# Patient Record
Sex: Female | Born: 1955 | Hispanic: Yes
Health system: Midwestern US, Academic
[De-identification: ages and names within clinical notes are randomized; demographics above are authoritative.]

---

## 2019-07-06 ENCOUNTER — Encounter: Admit: 2019-07-06 | Discharge: 2019-07-06

## 2019-07-06 DIAGNOSIS — M170 Bilateral primary osteoarthritis of knee: Secondary | ICD-10-CM

## 2019-07-23 ENCOUNTER — Ambulatory Visit: Admit: 2019-07-23 | Discharge: 2019-07-23

## 2019-07-23 ENCOUNTER — Encounter: Admit: 2019-07-23 | Discharge: 2019-07-23

## 2019-07-23 ENCOUNTER — Encounter: Admit: 2019-07-23 | Discharge: 2019-07-24

## 2019-07-23 DIAGNOSIS — M25552 Pain in left hip: Secondary | ICD-10-CM

## 2019-07-23 DIAGNOSIS — M170 Bilateral primary osteoarthritis of knee: Secondary | ICD-10-CM

## 2019-07-23 DIAGNOSIS — M1612 Unilateral primary osteoarthritis, left hip: Secondary | ICD-10-CM

## 2019-07-23 MED ORDER — ROPIVACAINE (PF) 5 MG/ML (0.5 %) IJ SOLN
5 mL | Freq: Once | INTRAMUSCULAR | 0 refills | Status: CP | PRN
Start: 2019-07-23 — End: ?
  Administered 2019-07-23: 17:00:00 5 mL via INTRAMUSCULAR

## 2019-07-23 MED ORDER — TRIAMCINOLONE ACETONIDE 40 MG/ML IJ SUSP
80 mg | Freq: Once | INTRAMUSCULAR | 0 refills | Status: CP | PRN
Start: 2019-07-23 — End: ?
  Administered 2019-07-23: 17:00:00 80 mg via INTRAMUSCULAR

## 2019-07-23 NOTE — Patient Instructions
It was our pleasure to see you today.  If you need anything further contact our office at [phone]  Luke Thompson MD & Kaysia Willard RN at [facility].    You received an injection today which included the following medications: Kenalog & Ropivacaine     Please follow the appropriate instructions below:   Recurring pain  Injections are done using a local anesthetic.  The numbing effect typically lasts for approximately one hour and then wears off. Expect pain to return after the first hour and then diminish within 2-3 days.   Rest the area  Be careful with the injected area/joint. Do not overuse this area.  Do not use this area for more than mild activities.  It is also important to not splint or keep this area completely still, unless instructed to do so.   Watch for signs of infection  Although precautions were taken to prevent infection, be alert for signs of infection.  This includes: fever > 100 degrees farenheit, increased warmth or redness in the injected area, redness spreading from the injected site or swelling.  If any of these develop, please call Lucas Thompson, MD's office at [phone].   No excessive exercise for 48 hours. Normal daily activity is fine.    No heat for 48 hours. Warm showers are ok.

## 2019-07-23 NOTE — Progress Notes
Date of Service: 07/23/2019     Subjective:               Joint Pain   This is a chronic (Left hip pain constant and getting worse over the last year. Bilatearl knee pain getting progressivly worse over the last 2 years. ) problem. The current episode started more than 1 year ago. The problem occurs daily. The problem has been gradually worsening. Associated symptoms include arthralgias and joint swelling. Pertinent negatives include no numbness or weakness. The symptoms are aggravated by exertion, bending and standing (sitting). She has tried ice, NSAIDs, rest, position changes and relaxation for the symptoms. The treatment provided no relief.       Regina Dean is a 63 y.o. female.  She comes in today with bilateral hip pain for the last year and bilateral knee pain for the last 2 years.  She denies injury.  This is been progressive.  Hip pain causes her most significant issue and is worse on the left than it is on the right.  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  She points at the anterior lateral aspect of the hip as a site of pain.  Pain does radiate to the groin.  The right side is similar but not nearly as bad.  This seems to wax and wane.  The left side bothers her consistently.  Both knees bother her regularly though she denies swelling.  This makes it difficult for her to get up off the ground and bend or squat.  She points to the anterior knee as a site of pain.  She denies recent injury.  No neurologic symptoms.       Review of Systems   Musculoskeletal: Positive for arthralgias and joint swelling.   Neurological: Negative for weakness and numbness.   All other systems reviewed and are negative.        Objective:         ??? atorvastatin (LIPITOR) 10 mg tablet every 24 hours.   ??? meloxicam (MOBIC) 15 mg tablet Take 15 mg by mouth daily.   ??? metFORMIN (GLUCOPHAGE) 500 mg tablet Take 500 mg by mouth twice daily.     Vitals:    07/23/19 0931   BP: (!) 143/86   Pulse: 74 SpO2: 99%   Weight: 64.9 kg (143 lb)   Height: 170.2 cm (67)     Body mass index is 22.4 kg/m???.     Physical Exam  Constitutional:       General: She is not in acute distress.     Appearance: She is well-developed. She is not diaphoretic.   HENT:      Head: Normocephalic and atraumatic.   Eyes:      Conjunctiva/sclera: Conjunctivae normal.   Neck:      Musculoskeletal: Normal range of motion.   Pulmonary:      Effort: Pulmonary effort is normal.   Musculoskeletal:      Right knee: She exhibits no effusion.      Left knee: She exhibits no effusion.   Skin:     General: Skin is warm and dry.      Findings: No erythema.   Neurological:      Mental Status: She is alert and oriented to person, place, and time.       Right Knee Exam     Tenderness   The patient is experiencing tenderness in the lateral joint line and medial joint line.  Range of Motion   The patient has normal right knee ROM.    Tests   McMurray:  Medial - negative Lateral - negative  Varus: negative Valgus: negative  Lachman:  Anterior - negative      Drawer:  Anterior - negative    Posterior - negative  Patellar apprehension: negative    Other   Erythema: absent  Scars: absent  Sensation: normal  Pulse: present  Swelling: none  Effusion: no effusion present      Left Knee Exam     Tenderness   The patient is experiencing tenderness in the lateral joint line and medial joint line.    Range of Motion   The patient has normal left knee ROM.    Tests   McMurray:  Medial - negative Lateral - negative  Varus: negative Valgus: negative  Lachman:  Anterior - negative      Drawer:  Anterior - negative     Posterior - negative  Patellar apprehension: negative    Other   Erythema: absent  Scars: absent  Sensation: normal  Pulse: present  Swelling: none  Effusion: no effusion present      Right Hip Exam     Tenderness   The patient is experiencing tenderness in the greater trochanter.    Range of Motion   Flexion: 120   External rotation: 60 Internal rotation: 20     Muscle Strength   Abduction: 5/5   Adduction: 5/5   Flexion: 5/5     Tests   FABER: negative  Ober: negative    Other   Erythema: absent  Scars: absent  Sensation: normal  Pulse: present    Comments:  FADIR positive.  Passive logroll negative.      Left Hip Exam     Tenderness   The patient is experiencing tenderness in the greater trochanter.    Range of Motion   Flexion: 90   External rotation: 60   Internal rotation: 10     Muscle Strength   Abduction: 5/5   Adduction: 5/5   Flexion: 5/5     Tests   FABER: negative  Ober: negative    Other   Erythema: absent  Scars: absent  Sensation: normal  Pulse: present    Comments:  FADIR positive.  Passive logroll positive.                 Assessment and Plan:       Regina Dean. Rider was seen today for pain, pain and pain.    Diagnoses and all orders for this visit:    Primary osteoarthritis of bilateral hips  -X-rays completed today of bilateral hips show marked femoral acetabular osteoarthritic change.  She has cystic change of the acetabulum as well as the femoral head bilaterally.  This is worse on the left than right.  -Pain likely secondary to chronic osteoarthritic change of bilateral hips.  Left worse than right.  The nature of this pathology was discussed with the patient.  -Imaging: No further imaging at this time  -We discussed treatment options including oral NSAIDs, Injection and/or PT  -Lifestyle modifications: Limit high impact activity.  -Medication: Oral medications as needed.  -Therapy: Recommended formal physical therapy at this time for proximal lower extremity strengthening.  -Intervention: Kenalog injection of the left hip completed today in clinic under ultrasound.  See procedure note for details.  Post procedure precautions were given.  -Activity: As tolerated      Bilateral primary  osteoarthritis of knee  -X-rays completed today show mild to moderate tricompartmental osteoarthritic change of bilateral knees.  This is worse in the patellofemoral space.  -Pain likely secondary to osteoarthritic change of bilateral knees.  The nature of this pathology was discussed with the patient.  -Imaging: No further imaging today.  -Lifestyle modifications:Limit high impact activity.  -Medication:Oral medications as needed.  -Therapy: Recommended formal physical therapy this time for proximal lower extremity strengthening.  -Intervention: No further intervention today.  May consider injection in the future.  -Activity: As tolerated        -RTO in 2 weeks or sooner PRN increased pain.  May consider injection of the right hip at that time.  We discussed potential for future hip replacement.  -The pt understands the above assessment and plan and has no further questions       Lynnda Child, MD      Parts of this clinic note were completed using a dragon voice recognition software system. Please excuse any misspellings or grammatical errors.  If you need clarification please contact my office.

## 2019-07-23 NOTE — Procedures
Large Joint Drain/Inject: L hip joint    Consent:   Consent obtained: verbal  Consent given by: patient  Risks discussed: arrhythmia, bleeding, damage to surrounding structures, hyperglycemia, infection, pain, skin discoloration and soft tissue reaction  Alternatives discussed: alternative treatment, delayed treatment and no treatment  Discussed with patient the purpose of the treatment/procedure, other ways of treating my condition, including no treatment/ procedure and the risks and benefits of the alternatives. Patient has decided to proceed with treatment/procedure.        Universal Protocol:  Imaging studies: imaging studies available  Site marked: the operative site was marked  Patient identity confirmed: Patient identify confirmed verbally with patient.          Procedures Details:  Indications: pain  Details:Prep: 2% chlorhexidine  Local anesthetic: ethyl chloride spray   22 G needle, ultrasound-guided  Justification for use of ultrasound guidance: The use of direct sonographic visualization of the needle was required to ensure accurate injection placement for diagnostic specificity, to maximize clinical efficacy and for safety purposes to minimize risk of bleeding or injury to nearby neurovascular structures   anterior approachMedications: 5 mL ropivacaine (PF) 0.5% (5 mg/mL); 80 mg triamcinolone acetonide 40 mg/mL  Outcome: tolerated well, no immediate complications

## 2019-07-24 ENCOUNTER — Encounter: Admit: 2019-07-24 | Discharge: 2019-07-24

## 2019-07-24 DIAGNOSIS — R079 Chest pain, unspecified: Secondary | ICD-10-CM

## 2019-07-24 DIAGNOSIS — E785 Hyperlipidemia, unspecified: Secondary | ICD-10-CM

## 2019-07-24 DIAGNOSIS — C4491 Basal cell carcinoma of skin, unspecified: Secondary | ICD-10-CM

## 2019-09-05 ENCOUNTER — Ambulatory Visit: Admit: 2019-09-05 | Discharge: 2019-09-05

## 2019-09-05 ENCOUNTER — Encounter: Admit: 2019-09-05 | Discharge: 2019-09-05

## 2019-09-05 DIAGNOSIS — C4491 Basal cell carcinoma of skin, unspecified: Secondary | ICD-10-CM

## 2019-09-05 DIAGNOSIS — M170 Bilateral primary osteoarthritis of knee: Secondary | ICD-10-CM

## 2019-09-05 DIAGNOSIS — R079 Chest pain, unspecified: Secondary | ICD-10-CM

## 2019-09-05 DIAGNOSIS — E785 Hyperlipidemia, unspecified: Secondary | ICD-10-CM

## 2019-09-05 NOTE — Patient Instructions
It was our pleasure to see you today.  If you need anything further contact our office at [phone]  Luke Thompson MD & Brietta Manso RN at [facility].    You received an injection today which included the following medications: Kenalog & Ropivacaine     Please follow the appropriate instructions below:   Recurring pain  Injections are done using a local anesthetic.  The numbing effect typically lasts for approximately one hour and then wears off. Expect pain to return after the first hour and then diminish within 2-3 days.   Rest the area  Be careful with the injected area/joint. Do not overuse this area.  Do not use this area for more than mild activities.  It is also important to not splint or keep this area completely still, unless instructed to do so.   Watch for signs of infection  Although precautions were taken to prevent infection, be alert for signs of infection.  This includes: fever > 100 degrees farenheit, increased warmth or redness in the injected area, redness spreading from the injected site or swelling.  If any of these develop, please call Lucas Thompson, MD's office at [phone].   No excessive exercise for 48 hours. Normal daily activity is fine.    No heat for 48 hours. Warm showers are ok.

## 2019-09-27 ENCOUNTER — Encounter: Admit: 2019-09-27 | Discharge: 2019-09-27 | Payer: No Typology Code available for payment source

## 2019-09-27 DIAGNOSIS — C4491 Basal cell carcinoma of skin, unspecified: Secondary | ICD-10-CM

## 2019-09-27 DIAGNOSIS — E785 Hyperlipidemia, unspecified: Secondary | ICD-10-CM

## 2019-09-27 DIAGNOSIS — R079 Chest pain, unspecified: Secondary | ICD-10-CM

## 2019-09-27 MED ORDER — ROPIVACAINE (PF) 5 MG/ML (0.5 %) IJ SOLN
3 mL | Freq: Once | INTRAMUSCULAR | 0 refills | Status: CP | PRN
Start: 2019-09-27 — End: ?
  Administered 2019-09-05: 16:00:00 3 mL via INTRAMUSCULAR

## 2019-09-27 MED ORDER — TRIAMCINOLONE ACETONIDE 40 MG/ML IJ SUSP
40 mg | Freq: Once | INTRAMUSCULAR | 0 refills | Status: CP | PRN
Start: 2019-09-27 — End: ?
  Administered 2019-09-05: 16:00:00 40 mg via INTRAMUSCULAR

## 2019-09-28 ENCOUNTER — Encounter: Admit: 2019-09-28 | Discharge: 2019-09-28 | Payer: No Typology Code available for payment source

## 2019-09-28 ENCOUNTER — Ambulatory Visit: Admit: 2019-09-28 | Discharge: 2019-09-28 | Payer: No Typology Code available for payment source

## 2019-09-28 DIAGNOSIS — R079 Chest pain, unspecified: Secondary | ICD-10-CM

## 2019-09-28 DIAGNOSIS — E785 Hyperlipidemia, unspecified: Secondary | ICD-10-CM

## 2019-09-28 DIAGNOSIS — M1612 Unilateral primary osteoarthritis, left hip: Secondary | ICD-10-CM

## 2019-09-28 DIAGNOSIS — C4491 Basal cell carcinoma of skin, unspecified: Secondary | ICD-10-CM

## 2019-09-28 MED ORDER — TRAMADOL 50 MG PO TAB
50 mg | ORAL_TABLET | ORAL | 0 refills | Status: DC | PRN
Start: 2019-09-28 — End: 2019-10-09

## 2019-09-28 MED ORDER — CYCLOBENZAPRINE 5 MG PO TAB
5 mg | ORAL_TABLET | Freq: Three times a day (TID) | ORAL | 0 refills | 30.00000 days | Status: DC | PRN
Start: 2019-09-28 — End: 2019-10-09

## 2019-09-28 NOTE — Progress Notes
Date of Service: 09/28/2019     Subjective:               Joint Pain   This is a chronic (Left hip pain intense pain causing decrease in ability to ambulate starting 09/26/19. ) problem. The current episode started more than 1 year ago. The problem occurs daily. The problem has been rapidly worsening. Associated symptoms include arthralgias and joint swelling. Pertinent negatives include no numbness or weakness. The symptoms are aggravated by exertion, bending and standing (sitting). She has tried ice, NSAIDs, rest, position changes and relaxation (Walker, advil and tylenol. ) for the symptoms. The treatment provided no relief.       Regina Dean is a 63 y.o. female.  She has a history of diabetes.  She returns to clinic today with her daughter as an interpreter.  She notes return of severe left hip pain over the last couple of days.  Last weekend she notes return of severe left hip pain over the last couple of days.  Last weekend she started noticing some decreased range of motion but no pain.  She notes return of severe left hip pain over the last couple of days.  Last weekend she started noticing some decreased range of motion but no pain.  With her previous injection she had near complete relief of her left hip pain.  On Wednesday she woke up and had severe pain and has not been able to bear weight since.  She points at the anterior and posterior aspect of the hip as a site of pain.  She feels like the pain is radiating through the center of her hip.  She denies injury.  Her knees are feeling improved.  She is here today for further recommendations.         Review of Systems   Musculoskeletal: Positive for arthralgias and joint swelling.   Neurological: Negative for weakness and numbness.   All other systems reviewed and are negative.        Objective:         ? aspirin EC 81 mg tablet Take 81 mg by mouth daily.   ? atorvastatin (LIPITOR) 10 mg tablet every 24 hours. ? cyclobenzaprine (FLEXERIL) 5 mg tablet Take one tablet by mouth three times daily as needed for Muscle Cramps.   ? FLAXSEED OIL (OMEGA 3 PO) Take  by mouth daily.   ? meloxicam (MOBIC) 15 mg tablet Take 15 mg by mouth daily.   ? meloxicam (MOBIC) 15 mg tablet Take  by mouth daily.   ? metFORMIN (GLUCOPHAGE) 500 mg tablet Take 500 mg by mouth twice daily.   ? traMADoL (ULTRAM) 50 mg tablet Take one tablet by mouth every 6 hours as needed for Pain.     Vitals:    09/28/19 0930   BP: 137/70   BP Source: Arm, Left Upper   Patient Position: Sitting   Pulse: 92   SpO2: 99%   PainSc: Ten     There is no height or weight on file to calculate BMI.     Physical Exam  Constitutional:       General: She is not in acute distress.     Appearance: She is well-developed. She is not diaphoretic.   HENT:      Head: Normocephalic and atraumatic.   Eyes:      Conjunctiva/sclera: Conjunctivae normal.   Neck:      Musculoskeletal: Normal range of motion.   Pulmonary:  Effort: Pulmonary effort is normal.   Musculoskeletal:      Right knee: She exhibits no effusion.      Left knee: She exhibits no effusion.   Skin:     General: Skin is warm and dry.      Findings: No erythema.   Neurological:      Mental Status: She is alert and oriented to person, place, and time.       Right Knee Exam     Tenderness   The patient is experiencing no tenderness.     Range of Motion   The patient has normal right knee ROM.    Tests   McMurray:  Medial - negative Lateral - negative  Varus: negative Valgus: negative  Lachman:  Anterior - negative      Drawer:  Anterior - negative    Posterior - negative  Pivot shift: negative  Patellar apprehension: negative    Other   Erythema: absent  Scars: absent  Sensation: normal  Pulse: present  Swelling: none  Effusion: no effusion present      Left Knee Exam     Tenderness   The patient is experiencing no tenderness.     Range of Motion   The patient has normal left knee ROM.    Tests McMurray:  Medial - negative Lateral - negative  Varus: negative Valgus: negative  Lachman:  Anterior - negative      Drawer:  Anterior - negative     Posterior - negative  Pivot shift: negative  Patellar apprehension: negative    Other   Erythema: absent  Scars: absent  Sensation: normal  Pulse: present  Swelling: none  Effusion: no effusion present      Left Hip Exam     Tenderness   The patient is experiencing tenderness in the posterior.    Range of Motion   Flexion: 20   External rotation: 10   Internal rotation: 10     Muscle Strength   Abduction: 3/5   Adduction: 3/5   Flexion: 3/5     Other   Erythema: absent  Scars: absent  Sensation: normal  Pulse: present                 Assessment and Plan:       Weslie Warburton. Feighner was seen today for pain, pain and pain.    Diagnoses and all orders for this visit:    Primary osteoarthritis of bilateral hips  -X-rays previously completed of bilateral hips which show marked femoral acetabular osteoarthritic change.  She has cystic change of the acetabulum as well as the femoral head bilaterally.  This is worse on the left than right.  I do not appreciate significant change in x-rays completed today compared to x-rays done 10 weeks ago.  -Pain likely secondary to chronic osteoarthritic change of bilateral hips.  She has had a rapid flareup.  -Imaging: No further imaging at this time  -Lifestyle modifications: Limit high impact activity.  -Medication: Prescribed Flexeril for glue tightness and tramadol for pain.  -Therapy: Continue home physical therapy as tolerated.  -Intervention: Urgent referral will be placed to see Dr. Opal Sidles for evaluation on potential hip replacement.  -Activity: As tolerated      Bilateral primary osteoarthritis of knee  -X-rays previously completed which show mild to moderate tricompartmental osteoarthritic change of bilateral knees.  This is worse in the patellofemoral space. -Pain likely secondary to osteoarthritic change of bilateral knees.  Pain significantly  improved with bilateral injections at her last visit.  -Imaging: No further imaging today.  -Lifestyle modifications:Limit high impact activity.  -Medication:Oral medications as needed. Continue previously prescribed Meloxicam.  -Therapy: Patient currently participating in physical therapy. Recommended continued therapy as long as she is progressing.  -Intervention: No further intervention today.  -Activity: As tolerated        -RTO PRN increased pain.  May consider injection of the right hip at that time.  We discussed potential for future hip replacement.  -The pt understands the above assessment and plan and has no further questions       Lynnda Child, MD      Parts of this clinic note were completed using a dragon voice recognition software system. Please excuse any misspellings or grammatical errors.  If you need clarification please contact my office.

## 2019-09-28 NOTE — Patient Instructions
It was our pleasure to see you today.  If you need anything further contact our office at [phone]  Luke Thompson MD & Chauncy Mangiaracina RN at [facility].

## 2019-10-03 LAB — SED RATE: Lab: 10 mm/h (ref 0–30)

## 2019-10-03 LAB — C REACTIVE PROTEIN (CRP): Lab: 0 mg/dL (ref <1.0)

## 2019-10-09 ENCOUNTER — Encounter: Admit: 2019-10-09 | Discharge: 2019-10-09

## 2019-10-09 MED ORDER — CYCLOBENZAPRINE 5 MG PO TAB
5 mg | ORAL_TABLET | Freq: Three times a day (TID) | ORAL | 0 refills | 21.00000 days | Status: DC | PRN
Start: 2019-10-09 — End: 2020-02-13

## 2019-10-09 MED ORDER — TRAMADOL 50 MG PO TAB
50 mg | ORAL_TABLET | ORAL | 0 refills | Status: DC | PRN
Start: 2019-10-09 — End: 2019-11-20

## 2019-10-11 ENCOUNTER — Encounter: Admit: 2019-10-11 | Discharge: 2019-10-11

## 2019-10-11 NOTE — Telephone Encounter
Explained lab results per interpreter Delfino Lovett [phone]. Negative results which indicates no infection. Tenisa states she has an appt with a new PCP and is trying to get an appt with a dentist. She will call me once ready to schedule THA

## 2019-11-05 ENCOUNTER — Ambulatory Visit: Admit: 2019-11-05 | Discharge: 2019-11-06 | Payer: No Typology Code available for payment source

## 2019-11-05 ENCOUNTER — Encounter: Admit: 2019-11-05 | Discharge: 2019-11-05

## 2019-11-05 ENCOUNTER — Ambulatory Visit: Admit: 2019-11-05 | Discharge: 2019-11-05 | Payer: No Typology Code available for payment source

## 2019-11-05 DIAGNOSIS — R3989 Other symptoms and signs involving the genitourinary system: Secondary | ICD-10-CM

## 2019-11-05 DIAGNOSIS — C4491 Basal cell carcinoma of skin, unspecified: Secondary | ICD-10-CM

## 2019-11-05 DIAGNOSIS — N898 Other specified noninflammatory disorders of vagina: Secondary | ICD-10-CM

## 2019-11-05 DIAGNOSIS — R339 Retention of urine, unspecified: Secondary | ICD-10-CM

## 2019-11-05 DIAGNOSIS — N952 Postmenopausal atrophic vaginitis: Secondary | ICD-10-CM

## 2019-11-05 DIAGNOSIS — N812 Incomplete uterovaginal prolapse: Secondary | ICD-10-CM

## 2019-11-05 DIAGNOSIS — R079 Chest pain, unspecified: Secondary | ICD-10-CM

## 2019-11-05 DIAGNOSIS — E785 Hyperlipidemia, unspecified: Secondary | ICD-10-CM

## 2019-11-05 DIAGNOSIS — N3946 Mixed incontinence: Secondary | ICD-10-CM

## 2019-11-05 NOTE — Progress Notes
DATE: 11/05/2019  REFERRING PHYSICIAN: Dr Referral Self      CHIEF COMPLAINT: Urinary Incontinence      History of Present Illness  Regina Dean is a 63 y.o. G3P3 PMP female seen for consultation regarding prolapse.  She has not had a hysterectomy.     She reports prolapse symptoms that started years ago. In 2013 she was fit with a pessary, which worked for a long time. She does not recall who she saw for this. About a year ago, the pessary was more easily expelled and she had some bleeding. She took it out about 3 months ago and did not reinsert it. (Sounds like a ring w/support). She has noticed a vaginal bulge that she can see and feel, worse since the pessary has been out. She does not report having pelvic pressure symptoms.  She does manually reduce the bulge.      She reports urinary symptoms for years. She reports urinary urgency and frequency and voids 15-20 times during the day and 5 times overnight. She reports leakage of urine with coughing, laughing, and sneezing. She reports leakage of urine with an urge. She does wear a pad for protection. She does feel that she empties her bladder completely but then has to void about 10 minutes later. She has not tried overactive bladder medications to help with her symptoms. She states she drinks 2-3 bottles of water daily and one coffee every morning.    She denies a history of UTIs.    She reports bowel symptoms including occasional constipation.  She denies fecal incontinence of gas and stool. She does not take fiber supplements. She has not had a colonoscopy.    She has left leg/hip pain is planning for a left hip replacement in the very near future.     She denies menopausal symptoms. She denies vaginal dryness.    She is not sexually active.      She reports that her mammograms are up to date and have been normal.  She reports that her last Pap smear was normal.    She has T2DM with most recent A1c 8%. She has another one pending in work-up for her total hip replacement.     REVIEW OF SYSTEMS:  Pertinent Review of Systems:  - General ROS: Denies recent weight change above 10 pounds, Denies malaise  - HEENT ROS: Denies blurring of vision. Denies double vision, Denies glaucoma, Denies dry eyes  - Cardiovascular ROS: Denies chest pain. Denies palpitations currently. Denies orthopnea  - Respiratory ROS: Denies shortness of breath,  Denies cough,  Denies wheezing currently  - Gastrointestinal ROS: Denies constipation, Denies gastric reflux,  Denies blood in stool, Denies diarrhea, Denies Irritable bowel syndrome, Denies nausea, Denies vomiting, Denies abdominal pain  - Endocrine ROS: POS polydipsia,  Denies excessive sweating. Denies hot flashes. Denies Thyroid disease  - Hematological and Lymphatic ROS: Denies easy bruisability , Denies current anemia, Denies adenopathy in the inguinal area  - Neurological ROS: Denies syncope, Denies numbness in lower extremities, Denies neuropathy. Denies shooting pain down the legs, Denies shooting pain in the lower back, Denies low back pain  - Musculoskeletal ROS: Denies Joint pain. Denies Edema in lower extremities. Denies fibromyalgia  - Psychological ROS: Denies depression, Denies anxiety, Denies thoughts of suicide, Denies thoughts of homicide, Denies recent seizure, Denies syncope   - Dermatological ROS: Denies eczema. Denies skin rashes in the groin area and other sites    ALLERGIES:  Patient has no known  allergies.    Medical History:   Diagnosis Date   ? Basal cell carcinoma of skin, site unspecified    ? Chest pain    ? Hyperlipemia        History reviewed. No pertinent surgical history.    Family History   Problem Relation Age of Onset   ? Unknown to Patient Mother    ? Unknown to Patient Father        Social History     Socioeconomic History   ? Marital status: Divorced     Spouse name: Not on file   ? Number of children: Not on file   ? Years of education: Not on file ? Highest education level: Not on file   Occupational History   ? Not on file   Tobacco Use   ? Smoking status: Never Smoker   ? Smokeless tobacco: Never Used   Substance and Sexual Activity   ? Alcohol use: No   ? Drug use: No   ? Sexual activity: Not on file   Other Topics Concern   ? Not on file   Social History Narrative    ** Merged History Encounter **              Current Outpatient Medications:   ?  aspirin EC 81 mg tablet, Take 81 mg by mouth daily., Disp: , Rfl:   ?  atorvastatin (LIPITOR) 10 mg tablet, every 24 hours., Disp: , Rfl:   ?  cyclobenzaprine (FLEXERIL) 5 mg tablet, Take one tablet by mouth three times daily as needed for Muscle Cramps., Disp: 30 tablet, Rfl: 0  ?  FLAXSEED OIL (OMEGA 3 PO), Take  by mouth daily., Disp: , Rfl:   ?  meloxicam (MOBIC) 15 mg tablet, Take 15 mg by mouth daily., Disp: , Rfl:   ?  metFORMIN (GLUCOPHAGE) 500 mg tablet, Take 500 mg by mouth twice daily., Disp: , Rfl:   ?  traMADoL (ULTRAM) 50 mg tablet, Take one tablet by mouth every 6 hours as needed for Pain., Disp: 20 tablet, Rfl: 0        Objective:         ? aspirin EC 81 mg tablet Take 81 mg by mouth daily.   ? atorvastatin (LIPITOR) 10 mg tablet every 24 hours.   ? cyclobenzaprine (FLEXERIL) 5 mg tablet Take one tablet by mouth three times daily as needed for Muscle Cramps.   ? FLAXSEED OIL (OMEGA 3 PO) Take  by mouth daily.   ? meloxicam (MOBIC) 15 mg tablet Take 15 mg by mouth daily.   ? metFORMIN (GLUCOPHAGE) 500 mg tablet Take 500 mg by mouth twice daily.   ? traMADoL (ULTRAM) 50 mg tablet Take one tablet by mouth every 6 hours as needed for Pain.     Vitals:    11/05/19 1253   BP: 129/72   BP Source: Arm, Right Upper   Patient Position: Sitting   Pulse: 76   Resp: 16   Temp: 36.8 ?C (98.3 ?F)   SpO2: 97%   Weight: 63.5 kg (140 lb)   Height: 171.5 cm (67.5)   PainSc: Zero     Body mass index is 21.6 kg/m?Marland Kitchen     Physical Exam    General appearance: not in acute distress, walking with normal gait Mental status :oriented to time, place and person; affect and mood appropriate; normal interaction  Cardiovascular: normal heart rate, bilateral LE pulses palpable  Chest / Respiratory: normal  respiratory effort, unlabored breathing   Back: No spine tenderness, no SI joint tenderness, No CVAT   Neck: No thryromegally, no adenopathy  Gastrointestinal / Abdomen: no masses, no rebound, no tenderness, soft, nondistended, no hernias, Laparoscopic (tubal ligation) incisions without tenderness   Extremities: no gross deformities, limited hip mobility, ambulates with a cane due to left hip/leg pain   Neruologic: no asymmetric weakness in LE, no abnormalities in sensation in LE  Peripheral vascular system: no cyanosis, no edema in LE, extremities warm  Dermatological: no perineal nevi or rashes noted   Genitourinary: firm, rubbery tissue noted at posterior vaginal apex just distal to posterior lip of cervix, friable and frond-like; tissue easily grasped with ring forceps and sent to pathology; 3cm linear area of hyperpigmentation at posterior introitus     Post Void Residual  (ml)  Void (spontaneous)       PVR by bladder scan 48 mL        Urinalysis  Leuks                 neg      Nitrites                 neg      Heme                 +                      (one choice per column)  Ext. Genitalia      Lesions noted No    Appearance consistent with age Yes   Vagina       Discharge Present No    Mucosa Atrophic Yes    Tissue Friable No    Tissue Tender To Palpation. No    Bladder      Masses Noted No    Tender to Palpation See below   Urethra      Midline Yes    Normal Size Yes    Urethral Tenderness On Palpation See below    Urethral Discharge Noted No    Any Lesions Noted. No     Caruncle No   Uterus        Normal Size Normal    Mobile Mobile    Tender NT   Adnexa       Masses palpated  None    Tenderness noted NT   Bimanual       Masses palpated  None   Perineum       visually intact. Yes    Anus External Hemorrhoids None   Myofascial Tenderness      Rt OI  0/10       Rt iliococcygeus  0/10       Rt puboccygeus/puborectalis  0/10       Posterior vaginal  0/10       Lt pubococcygeus/puborectalis  0/10       Lt iliococcygeus  0/10       Lt OI  0/10       Bladder  0/10       Urethra  0/10       Lt vulva  0/10       Rt vulva  0/10       Reflexes Bulbocavernosus Present    Anal wink Present         Oxford Squeeze strength: (0-5) 4 out of 5    Grade 0:  No discernible contraction Grade 1:  Very weak contraction, a?flicker?Marland Kitchen .  Grade 2:   Weak contraction Grade 3: Moderate contraction with some squeeze and lift ability. Grade 4 :   Good contraction; squeeze and lift against resistance.  Grade 5: Strong contraction, squeeze and lift against strong resistance.         Continence Stress Test (supine stress test)  negative      Pelvic Organ Prolapse Quantification (POP-Q):    Aa: +2.5 Ba:+2.5 C: -4   GH: 1.5/4.5 PB: 3/3 TVL: 6   Ap: -2.5 Bp:  -2.5 D: -4     Stage 3 anterior-predominant         ASSESSMENT:  Regina Dean is a 63 y.o.  G3P3 female seen in consultation and found to have Stage 3 anterior-predominant uterovaginal prolapse, urgency-predominant mixed urinary incontinence, and a firm, rubbery area of tissue at the upper posterior vagina.    CONSULTATION:  Prolapse:   - Management options were discussed for Stage 3 anterior-predominant uterovaginal prolapse including expectant management, conservative management (a pessary), and surgical management (obliterative vs reconstructive).   - She is not currently sexually active.     Mixed urinary incontinence:   - Management options were discussed for urgency-predominant MUI including expectant, conservative (pelvic floor physical therapy, incontinence pessary), medical management and surgical management (synthetic midurethral sling, sacral neuromodulation, intravesicular botulinum toxin injections). - Behavioral modifications including monitoring fluid intake (goal 50-60 oz per day), avoidance of bladder irritants (caffeine, artificial sweeteners, acidic foods), timed voids, and avoidance of excessive fluid intake before bedtime were reviewed.    PLAN:  Prolapse:   - She desires to have conservative management with a pessary while pre/post-op from her hip replacement. She may be interested in surgical management in the future.   - She will return for a pessary fitting in 2-3 weeks to allow for healing of the posterior vagina.     Mixed urinary incontinence:   - A urine culture is being sent today to rule out a UTI as a possible cause of her symptoms.   - She will incorporate the behavioral modifications discussed above.  - We reviewed the importance of blood sugar control as elevated blood sugars can exacerbate urinary symptoms  - Will readdress urinary symptoms with pessary placement.    Vaginal lesion:  - Discussed that tissue abnormality may reflect inflammatory changes from long-term pessary use vs epithelial hyperplasia/malignancy  - Specimen sent for pathology today  - Recommend awaiting pessary placement until tissue healed    Return to clinic for pessary fitting

## 2019-11-05 NOTE — Procedures
Within 10 minutes of voiding, a bladder scan was performed and showed a PVR of 48 mL.

## 2019-11-05 NOTE — Progress Notes
Pertinent Review of Systems:  - General ROS: Denies recent weight change above 10 pounds, Denies malaise  - HEENT ROS: Denies blurring of vision. Denies double vision, Denies glaucoma, Denies dry eyes  - Cardiovascular ROS: Denies chest pain. Denies palpitations currently. Denies orthopnea  - Respiratory ROS: Denies shortness of breath,  Denies cough,  Denies wheezing currently  - Gastrointestinal ROS: Denies constipation, Denies gastric reflux,  Denies blood in stool, Denies diarrhea, Denies Irritable bowel syndrome, Denies nausea, Denies vomiting, Denies abdominal pain  - Endocrine ROS: DeniesPOS polydipsia,  Denies excessive sweating. Denies hot flashes. Denies Thyroid disease  - Hematological and Lymphatic ROS: Denies easy bruisability , Denies current anemia, Denies adenopathy in the inguinal area  - Neurological ROS: Denies syncope, Denies numbness in lower extremities, Denies neuropathy. Denies shooting pain down the legs, Denies shooting pain in the lower back, Denies low back pain  - Musculoskeletal ROS: Denies Joint pain. Denies Edema in lower extremities. Denies fibromyalgia  - Psychological ROS: Denies depression, Denies anxiety, Denies thoughts of suicide, Denies thoughts of homicide, Denies recent seizure, Denies syncope   - Dermatological ROS: Denies eczema. Denies skin rashes in the groin area and other sites

## 2019-11-06 DIAGNOSIS — R3989 Other symptoms and signs involving the genitourinary system: Secondary | ICD-10-CM

## 2019-11-20 ENCOUNTER — Encounter: Admit: 2019-11-20 | Discharge: 2019-11-20

## 2019-11-20 MED ORDER — TRAMADOL 50 MG PO TAB
50 mg | ORAL_TABLET | ORAL | 0 refills | Status: DC | PRN
Start: 2019-11-20 — End: 2020-05-02

## 2019-11-26 MED ORDER — ESTRADIOL 0.01 % (0.1 MG/GRAM) VA CREA
1 g | Freq: Every day | VAGINAL | 11 refills | 30.00000 days | Status: DC
Start: 2019-11-26 — End: 2020-02-13

## 2019-12-10 ENCOUNTER — Ambulatory Visit: Admit: 2019-12-10 | Discharge: 2019-12-11 | Payer: No Typology Code available for payment source

## 2019-12-10 ENCOUNTER — Encounter: Admit: 2019-12-10 | Discharge: 2019-12-10

## 2019-12-10 DIAGNOSIS — C4491 Basal cell carcinoma of skin, unspecified: Secondary | ICD-10-CM

## 2019-12-10 DIAGNOSIS — N812 Incomplete uterovaginal prolapse: Secondary | ICD-10-CM

## 2019-12-10 DIAGNOSIS — R079 Chest pain, unspecified: Secondary | ICD-10-CM

## 2019-12-10 DIAGNOSIS — E785 Hyperlipidemia, unspecified: Secondary | ICD-10-CM

## 2019-12-10 NOTE — Progress Notes
PESSARY CHECK    CC: Pessary Maintainance    HPI  Regina Dean is a 63 y.o. female here today for a pessary check. She has Stage 3 anterior-predominant uterovaginal prolapse and desires to continue conservative management with a pessary. She was last seen on 11/26/19 for a pessary fitting and was fit with a 2 1/4 gelhorn pessary. At her initial visit she was noted to have a large area of vaginal granulation tissue, which was biopsied and treated with silver nitrate. This area looked significantly improved on follow up examination 2 weeks ago.     She reports that the pessary is working well. She does not remove her pessary independently. She does use vaginal estrogen therapy nightly and will transition to twice weekly.  She denies discharge. She denies bleeding. She denies irritation/pain.    She denies difficulty with urination. She does feel that she empties her bladder well.     She denies bowel symptoms.    Pertinent Review of Systems:  - General ROS: Denies recent weight change above 10 pounds, Denies malaise  - HEENT ROS: Denies blurring of vision. Denies double vision, Denies glaucoma, Denies dry eyes  - Cardiovascular ROS: Denies chest pain. Denies palpitations currently. Denies orthopnea  - Respiratory ROS: Denies shortness of breath,  Denies cough,  Denies wheezing currently  - Gastrointestinal ROS: Denies constipation, Denies gastric reflux,  Denies blood in stool, Denies diarrhea, Denies Irritable bowel syndrome, Denies nausea, Denies vomiting, Denies abdominal pain  - Endocrine ROS: Denies polydipsia,  Denies excessive sweating. Denies hot flashes. Denies Thyroid disease  - Hematological and Lymphatic ROS: Denies easy bruisability , Denies current anemia, Denies adenopathy in the inguinal area  - Neurological ROS: Denies syncope, Denies numbness in lower extremities, Denies neuropathy. Denies shooting pain down the legs, Denies shooting pain in the lower back, Denies low back pain - Musculoskeletal ROS: POS Joint pain. Denies Edema in lower extremities. Denies fibromyalgia  - Psychological ROS: Denies depression, Denies anxiety, Denies thoughts of suicide, Denies thoughts of homicide, Denies recent seizure, Denies syncope   - Dermatological ROS: Denies eczema. Denies skin rashes in the groin area and other sites    Medical History:   Diagnosis Date   ? Basal cell carcinoma of skin, site unspecified    ? Chest pain    ? Hyperlipemia      No past surgical history on file.  (Not in a hospital admission)    Current Outpatient Medications   Medication Sig Dispense Refill   ? aspirin EC 81 mg tablet Take 81 mg by mouth daily.     ? atorvastatin (LIPITOR) 10 mg tablet every 24 hours.     ? cyclobenzaprine (FLEXERIL) 5 mg tablet Take one tablet by mouth three times daily as needed for Muscle Cramps. 30 tablet 0   ? estradioL (ESTRACE) 0.01 % (0.1 mg/g) vaginal cream Insert or Apply one g to vaginal area daily. 42.5 g 11   ? FLAXSEED OIL (OMEGA 3 PO) Take  by mouth daily.     ? meloxicam (MOBIC) 15 mg tablet Take 15 mg by mouth daily.     ? metFORMIN (GLUCOPHAGE) 500 mg tablet Take 500 mg by mouth twice daily.     ? traMADoL (ULTRAM) 50 mg tablet Take one tablet by mouth every 6 hours as needed for Pain. 20 tablet 0     No current facility-administered medications for this visit.      No Known Allergies  Social History  Tobacco Use   ? Smoking status: Never Smoker   ? Smokeless tobacco: Never Used   Substance Use Topics   ? Alcohol use: No       Objective:   Vitals:    12/10/19 1554   BP: 130/69   Pulse: 82   Resp: 16   Temp: 36.1 ?C (97 ?F)   SpO2: 100%   Weight: 67.1 kg (148 lb)   Height: 172.7 cm (68)   PainSc: Zero       Physical Exam:  CONSTITUTIONAL:   Well developed.  Well nourished.   RESPIRATORY: Normal respiratory effort.   CARDIOVASCULAR:  Normal peripheral vascular exam.  ABDOMINAL:  Abdomen, soft and nontender.    SKIN:   Normal inspection. MUSCULOSKELETAL: No deformities/weakness. Moves all extremities well.   NEUROLOGIC:  Normal reflexes.  PSYCHIATRIC:  Oriented X 3.    GENITOURINARY:  External Genitalia:  Normal appearance, no lesions.    Urethral Meatus: Normal size and location. and No lesions or prolapse.  Urethra:  No masses or tenderness.  Bladder:  No masses or tenderness.  Vagina: Atrophic changes noted, significant improvement in the previously identified granulation tissue with only mild erythema, silver nitrate applied again today  Cervix: No lesions.  Uterus: Mobile, nontender  Adnexa/Parametrial:  No palpable masses, no tenderness.   Perineal Sensation: Normal.     Pessary Check: The pessary was removed, cleaned, her vagina inspected, and the pessary replaced.    ASSESSMENT:  Regina Dean?is a 63 y.o.??G3P3?female here for pessary check for Stage 3 anterior-predominant uterovaginal prolapse.  ?  PLAN:  Prolapse:   - She desires to continue conservative management with a pessary while pre/post-op from her hip replacement (planned in the new year once her A1c is under better control).?She may be interested in surgical management in the future.?  - Return to clinic in 3 months for pessary check  ?  Vaginal atrophy:  - Resolution of rubbery vaginal granulation tissue, which was biopsied last visit.  - Continue vaginal estrogen cream twice weekly    Return to clinic in 3months

## 2019-12-10 NOTE — Progress Notes
Pertinent Review of Systems:  - General ROS: Denies recent weight change above 10 pounds, Denies malaise  - HEENT ROS: Denies blurring of vision. Denies double vision, Denies glaucoma, Denies dry eyes  - Cardiovascular ROS: Denies chest pain. Denies palpitations currently. Denies orthopnea  - Respiratory ROS: Denies shortness of breath,  Denies cough,  Denies wheezing currently  - Gastrointestinal ROS: Denies constipation, Denies gastric reflux,  Denies blood in stool, Denies diarrhea, Denies Irritable bowel syndrome, Denies nausea, Denies vomiting, Denies abdominal pain  - Endocrine ROS: Denies polydipsia,  Denies excessive sweating. Denies hot flashes. Denies Thyroid disease  - Hematological and Lymphatic ROS: Denies easy bruisability , Denies current anemia, Denies adenopathy in the inguinal area  - Neurological ROS: Denies syncope, Denies numbness in lower extremities, Denies neuropathy. Denies shooting pain down the legs, Denies shooting pain in the lower back, Denies low back pain  - Musculoskeletal ROS: POS Joint pain. Denies Edema in lower extremities. Denies fibromyalgia  - Psychological ROS: Denies depression, Denies anxiety, Denies thoughts of suicide, Denies thoughts of homicide, Denies recent seizure, Denies syncope   - Dermatological ROS: Denies eczema. Denies skin rashes in the groin area and other sites

## 2019-12-13 ENCOUNTER — Encounter: Admit: 2019-12-13 | Discharge: 2019-12-13

## 2020-01-01 ENCOUNTER — Encounter: Admit: 2020-01-01 | Discharge: 2020-01-01

## 2020-01-28 ENCOUNTER — Encounter: Admit: 2020-01-28 | Discharge: 2020-01-28 | Payer: No Typology Code available for payment source

## 2020-01-28 NOTE — Progress Notes
NAME: Regina Dean    MRN: [facility]# [id number]    DOB: Oct 03, 1956    [facility]   Genetic Counseling  Date:01/28/2020  Dx Code: Z84.8, Z80.3, Z80.41, Z80.8    Referring physician: Nash Dimmer, MD     Regina Dean, age 64 y.o., was called by the Genetic Counseling Clinic today, with her son and daughter, for consultation regarding her family history of a known BRCA1 mutation ([id number]). Patient?s verbal consent was obtained to provide this telehealth visit due to the [facility].    Clinical History: Regina Dean has a history of non-melanoma skin cancers for which she has had them removed. She has mammograms every other year and has never had a colonoscopy. She is scheduled for hip surgery later this year. There were no other major medical concerns reported at today's appointment.      Family History?Mexican?no known Jewish ancestry   Problem Relation Age of Onset   ? Tumor benign adrenal Mother ?   ? Colon Polyps few, diverticulities Mother 61   ? Complete Hysterectomy ov? Mother 44   ? Cancer-Breast Mother 65   ? Colon Polyps Father ?   ? Cancer-Breast BRCA1 mutation Sister 65   ? Cancer ?Bone? Maternal Aunt 72   ? Cancer Bone? Maternal Grandmother 70   ? Cancer Lymphoma Maternal Grandfather 60   ? Cancer-Uterine Paternal Grandmother 73   ? ????cervical, ovarian? Radiation treatment   ? Cancer Bone Paternal Grandmother 47   ? Cancer-Ovarian?(negative 22 gene panel) Daughter 19   ? Pituitary Tumor Nephew ?   ? ????treatment- no surgery yet   ? Cancer?unknown primary?? Maternal Aunt ?   ? Cancer? Maternal first cousin? ?   ? ????leg- bone or soft tissue?   ? Cancer? Maternal first cousin? ?   ? ????unknwon primary- metastatic Discussion:  We discussed hereditary cancer syndromes involving breast, ovarian, bone and unknown cancers in the family.  Red flags for hereditary syndromes include bilateral / multifocal disease, young age of onset (often <50 yrs), and 2-3 relatives with the same, or related conditions.    We discussed the NCCN guidelines version 2.2021 regarding genetic testing for hereditary breast and ovarian cancer. [place] meets criteria for testing due to the known familial BRCA1 mutation.     Testing for multiple genes for cancer susceptibility was recommended (a genetic panel). Due to insurance concerns Regina Dean requested to only have the BRCA1 gene assessed. IF the familial mutation is found it would affect medical management including surveillance for early detection and treatment decisions. We discussed we would revisit panel testing once her results come back for the BRCA1 familial mutation.     The discussion included an explanation of the different types of results which may be reported (positive / negative).  We discussed the various syndromes and diseases associated with the genes on the panel.     Our discussion included the patient's increased risk status, how genes affect cancer susceptibility, potential benefits, risks, and limitations of testing, and methods for early detection and prevention.    Plan:  A saliva sample will be sent to [facility] and then sent to [facility] for BRCA1 analysis: 1 gene     Time:  60 minutes.  Obtained and reviewed family history.  Discussed hereditary cancer syndromes, autosomal dominant inheritance / other inheritance as relevant, presymptomatic surveillance, management and treatment. Possible testing outcomes, strategies for testing other family members if a gene alteration is found. Insurance information and out of  pocket price discussed. Completed paperwork including verbal consent for genetic testing.      Adela Glimpse, MS, [facility]  Certified Genetic Counselor [facility]  [address] [place], [place]  [address]    Email: [email or web address]  Telephone [phone]   Fax:  [phone]  Appointments [phone]    Pending:  We discussed that I would call Regina Dean when her results are reported

## 2020-01-29 ENCOUNTER — Encounter: Admit: 2020-01-29 | Discharge: 2020-01-29

## 2020-01-29 NOTE — Telephone Encounter
Spoke at length to daughter Besan.   After receiving dental clearance form from patients dentist I called dental office. Staff states that patient needs "a deep cleaning and has a few cavities". Explained that Dr. Christoper Fabian does require that patient proceed with dentist recommendations. Dtr verb understanding and will call to schedule pt with dentist. I will fax new clearance form to daughter for the dentist to complete after treatment.   Dtr reports that her moms A1C is 6.1, however per my documentation from PCP A1C is 6.6 on 01/24/20. Explained I will call PCP to clarify as this is a discrepancy.

## 2020-02-05 ENCOUNTER — Encounter: Admit: 2020-02-05 | Discharge: 2020-02-05

## 2020-02-05 DIAGNOSIS — M1612 Unilateral primary osteoarthritis, left hip: Secondary | ICD-10-CM

## 2020-02-05 DIAGNOSIS — Z20822 Encounter for screening laboratory testing for COVID-19 virus in asymptomatic patient: Secondary | ICD-10-CM

## 2020-02-05 MED ORDER — CEFAZOLIN INJ 1GM IVP
2 g | Freq: Once | INTRAVENOUS | 0 refills | Status: CN
Start: 2020-02-05 — End: ?

## 2020-02-05 NOTE — Progress Notes
Regina Deed, MD  Regina Freeze, RN            FYI    Previous Messages    ----- Message -----   From: Daiva Eves, DDS   Sent: 02/01/2020 12:04 PM CST   To: Regina Deed, MD     Hello Dr. Christoper Fabian,     I am the dentist who saw [name]. We completed her restorations and single extraction. She had a cleaning 5 months ago and will be due in March, but I do not feel this should impede her hip replacement. She does not have any untreated infection, and overall her dentition is in good repair. From a dental perspective she can proceed with surgery.     We faxed the clearance paper back, but the son wanted me to speak with you about her case.     Thank you,     Caryl Pina

## 2020-02-13 ENCOUNTER — Encounter: Admit: 2020-02-13 | Discharge: 2020-02-13 | Primary: Family

## 2020-02-13 ENCOUNTER — Ambulatory Visit: Admit: 2020-02-13 | Discharge: 2020-02-13 | Payer: No Typology Code available for payment source | Primary: Family

## 2020-02-13 ENCOUNTER — Ambulatory Visit: Admit: 2020-02-13 | Discharge: 2020-02-14 | Payer: No Typology Code available for payment source | Primary: Family

## 2020-02-13 DIAGNOSIS — M1612 Unilateral primary osteoarthritis, left hip: Secondary | ICD-10-CM

## 2020-02-13 DIAGNOSIS — E785 Hyperlipidemia, unspecified: Secondary | ICD-10-CM

## 2020-02-13 DIAGNOSIS — E119 Type 2 diabetes mellitus without complications: Secondary | ICD-10-CM

## 2020-02-13 DIAGNOSIS — R079 Chest pain, unspecified: Secondary | ICD-10-CM

## 2020-02-13 DIAGNOSIS — C4491 Basal cell carcinoma of skin, unspecified: Secondary | ICD-10-CM

## 2020-02-13 LAB — COMPREHENSIVE METABOLIC PANEL
Lab: 0.4 mg/dL (ref 0.3–1.2)
Lab: 0.4 mg/dL (ref 0.4–1.00)
Lab: 10 10*3/uL (ref 3–12)
Lab: 10 mg/dL (ref 7–25)
Lab: 10 mg/dL (ref 8.5–10.6)
Lab: 102 MMOL/L (ref 98–110)
Lab: 113 mg/dL — ABNORMAL HIGH (ref 70–100)
Lab: 138 MMOL/L (ref 137–147)
Lab: 14 U/L (ref 7–40)
Lab: 14 U/L (ref 7–56)
Lab: 4.1 MMOL/L (ref 3.5–5.1)
Lab: 4.7 g/dL (ref 3.5–5.0)
Lab: 59 U/L (ref 25–110)
Lab: 7.2 g/dL (ref 6.0–8.0)
Lab: >60 mL/min (ref >60)
Lab: >60 mL/min (ref >60)

## 2020-02-13 LAB — CBC AND DIFF
Lab: 0 10*3/uL (ref 0–0.20)
Lab: 0 10*3/uL (ref 0–0.45)
Lab: 5.3 10*3/uL (ref 4.5–11.0)

## 2020-02-13 LAB — PROTIME INR (PT): Lab: 1 MMOL/L (ref 0.8–1.2)

## 2020-02-19 ENCOUNTER — Encounter: Admit: 2020-02-19 | Discharge: 2020-02-20 | Payer: No Typology Code available for payment source | Primary: Family

## 2020-02-20 DIAGNOSIS — Z20822 Contact with and (suspected) exposure to covid-19: Principal | ICD-10-CM

## 2020-02-20 LAB — COVID-19 (SARS-COV-2) PCR

## 2020-02-21 ENCOUNTER — Encounter: Admit: 2020-02-21 | Discharge: 2020-02-21 | Primary: Family

## 2020-02-21 DIAGNOSIS — E119 Type 2 diabetes mellitus without complications: Secondary | ICD-10-CM

## 2020-02-21 DIAGNOSIS — R079 Chest pain, unspecified: Secondary | ICD-10-CM

## 2020-02-21 DIAGNOSIS — E785 Hyperlipidemia, unspecified: Secondary | ICD-10-CM

## 2020-02-21 DIAGNOSIS — C4491 Basal cell carcinoma of skin, unspecified: Secondary | ICD-10-CM

## 2020-02-21 MED ORDER — METFORMIN 500 MG PO TAB
500 mg | Freq: Two times a day (BID) | ORAL | 0 refills | Status: DC
Start: 2020-02-21 — End: 2020-02-22
  Administered 2020-02-22: 15:00:00 500 mg via ORAL

## 2020-02-21 MED ORDER — ONDANSETRON HCL (PF) 4 MG/2 ML IJ SOLN
INTRAVENOUS | 0 refills | Status: DC
Start: 2020-02-21 — End: 2020-02-21
  Administered 2020-02-21: 22:00:00 4 mg via INTRAVENOUS

## 2020-02-21 MED ORDER — ACETAMINOPHEN 325 MG PO TAB
650 mg | Freq: Four times a day (QID) | ORAL | 0 refills | Status: DC
Start: 2020-02-21 — End: 2020-02-22
  Administered 2020-02-22 (×3): 650 mg via ORAL

## 2020-02-21 MED ORDER — SITAGLIPTIN 100 MG PO TAB
100 mg | Freq: Every day | ORAL | 0 refills | Status: CN
Start: 2020-02-21 — End: ?

## 2020-02-21 MED ORDER — CEFAZOLIN INJ 1GM IVP
2 g | Freq: Once | INTRAVENOUS | 0 refills | Status: CP
Start: 2020-02-21 — End: ?
  Administered 2020-02-21: 20:00:00 2 g via INTRAVENOUS

## 2020-02-21 MED ORDER — LIDOCAINE (PF) 200 MG/10 ML (2 %) IJ SYRG
0 refills | Status: DC
Start: 2020-02-21 — End: 2020-02-21
  Administered 2020-02-21: 20:00:00 40 mg via INTRAVENOUS

## 2020-02-21 MED ORDER — ACETAMINOPHEN 500 MG PO TAB
1000 mg | Freq: Once | ORAL | 0 refills | Status: CP
Start: 2020-02-21 — End: ?
  Administered 2020-02-21: 19:00:00 1000 mg via ORAL

## 2020-02-21 MED ORDER — INSULIN ASPART 100 UNIT/ML SC FLEXPEN
0-6 [IU] | Freq: Before meals | SUBCUTANEOUS | 0 refills | Status: DC
Start: 2020-02-21 — End: 2020-02-22

## 2020-02-21 MED ORDER — FENTANYL CITRATE (PF) 50 MCG/ML IJ SOLN
0 refills | Status: DC
Start: 2020-02-21 — End: 2020-02-21
  Administered 2020-02-21: 20:00:00 100 ug via INTRAVENOUS

## 2020-02-21 MED ORDER — TRAMADOL 50 MG PO TAB
50 mg | ORAL | 0 refills | Status: DC | PRN
Start: 2020-02-21 — End: 2020-02-22
  Administered 2020-02-22 (×2): 50 mg via ORAL

## 2020-02-21 MED ORDER — ATORVASTATIN 10 MG PO TAB
10 mg | Freq: Every day | ORAL | 0 refills | Status: CN
Start: 2020-02-21 — End: ?

## 2020-02-21 MED ORDER — DIPHENHYDRAMINE HCL 50 MG/ML IJ SOLN
25 mg | Freq: Once | INTRAVENOUS | 0 refills | Status: DC | PRN
Start: 2020-02-21 — End: 2020-02-21

## 2020-02-21 MED ORDER — PHENYLEPHRINE HCL 10 MG/ML IJ SOLN
0 refills | Status: DC
Start: 2020-02-21 — End: 2020-02-21
  Administered 2020-02-21 (×2): 100 ug via INTRAVENOUS

## 2020-02-21 MED ORDER — FENTANYL CITRATE (PF) 50 MCG/ML IJ SOLN
50 ug | INTRAVENOUS | 0 refills | Status: DC | PRN
Start: 2020-02-21 — End: 2020-02-21

## 2020-02-21 MED ORDER — OXYCODONE 5 MG PO TAB
5-10 mg | ORAL_TABLET | ORAL | 0 refills | 6.00000 days | Status: DC | PRN
Start: 2020-02-21 — End: 2020-05-02

## 2020-02-21 MED ORDER — ATORVASTATIN 10 MG PO TAB
10 mg | Freq: Every day | ORAL | 0 refills | Status: DC
Start: 2020-02-21 — End: 2020-02-22
  Administered 2020-02-22: 15:00:00 10 mg via ORAL

## 2020-02-21 MED ORDER — TRAMADOL 50 MG PO TAB
50 mg | Freq: Once | ORAL | 0 refills | Status: CP
Start: 2020-02-21 — End: ?
  Administered 2020-02-21: 19:00:00 50 mg via ORAL

## 2020-02-21 MED ORDER — PROPOFOL INJ 10 MG/ML IV VIAL
0 refills | Status: DC
Start: 2020-02-21 — End: 2020-02-21
  Administered 2020-02-21: 20:00:00 20 mg via INTRAVENOUS
  Administered 2020-02-21: 20:00:00 30 mg via INTRAVENOUS

## 2020-02-21 MED ORDER — CEFAZOLIN INJ 1GM IVP
1 g | INTRAVENOUS | 0 refills | Status: CP
Start: 2020-02-21 — End: ?
  Administered 2020-02-22 (×2): 1 g via INTRAVENOUS

## 2020-02-21 MED ORDER — VANCOMYCIN 1,000 MG IV SOLR
0 refills | Status: DC
Start: 2020-02-21 — End: 2020-02-21
  Administered 2020-02-21: 22:00:00 1 g via TOPICAL

## 2020-02-21 MED ORDER — LACTATED RINGERS IV SOLP
INTRAVENOUS | 0 refills | Status: DC
Start: 2020-02-21 — End: 2020-02-21
  Administered 2020-02-21: 18:00:00 1000.000 mL via INTRAVENOUS

## 2020-02-21 MED ORDER — ALUMINUM-MAGNESIUM HYDROXIDE 200-200 MG/5 ML PO SUSP
30 mL | ORAL | 0 refills | Status: DC | PRN
Start: 2020-02-21 — End: 2020-02-22

## 2020-02-21 MED ORDER — ROPIVACAINE-EPI-CLONID-KETOROL 2.46-0.005- 0.0008-0.3MG/ML PATC SYRG
0 refills | Status: DC
Start: 2020-02-21 — End: 2020-02-21
  Administered 2020-02-21: 22:00:00 50 mL

## 2020-02-21 MED ORDER — FENTANYL CITRATE (PF) 50 MCG/ML IJ SOLN
25 ug | INTRAVENOUS | 0 refills | Status: DC | PRN
Start: 2020-02-21 — End: 2020-02-21

## 2020-02-21 MED ORDER — DOCUSATE SODIUM 100 MG PO CAP
100 mg | Freq: Two times a day (BID) | ORAL | 0 refills | Status: DC
Start: 2020-02-21 — End: 2020-02-22
  Administered 2020-02-22 (×2): 100 mg via ORAL

## 2020-02-21 MED ORDER — PROPOFOL 10 MG/ML IV EMUL 50 ML (INFUSION)(AM)(OR)
0 refills | Status: DC
Start: 2020-02-21 — End: 2020-02-21
  Administered 2020-02-21: 20:00:00 130 ug/kg/min via INTRAVENOUS

## 2020-02-21 MED ORDER — TRANEXAMIC ACID IN NACL,ISO-OS 1,000 MG/100 ML (10 MG/ML) IV PGBK
0 refills | Status: DC
Start: 2020-02-21 — End: 2020-02-21
  Administered 2020-02-21 (×2): 1 g via INTRAVENOUS

## 2020-02-21 MED ORDER — METOCLOPRAMIDE HCL 5 MG/ML IJ SOLN
10 mg | Freq: Once | INTRAVENOUS | 0 refills | Status: DC | PRN
Start: 2020-02-21 — End: 2020-02-21

## 2020-02-21 MED ORDER — HYDROMORPHONE (PF) 2 MG/ML IJ SYRG
.5-1 mg | INTRAVENOUS | 0 refills | Status: DC | PRN
Start: 2020-02-21 — End: 2020-02-21

## 2020-02-21 MED ORDER — METFORMIN 500 MG PO TAB
500 mg | Freq: Two times a day (BID) | ORAL | 0 refills | Status: CN
Start: 2020-02-21 — End: ?

## 2020-02-21 MED ORDER — MAGNESIUM HYDROXIDE 2,400 MG/10 ML PO SUSP
10 mL | Freq: Every day | ORAL | 0 refills | Status: DC
Start: 2020-02-21 — End: 2020-02-22
  Administered 2020-02-22: 03:00:00 10 mL via ORAL

## 2020-02-21 MED ORDER — GABAPENTIN 300 MG PO CAP
300 mg | Freq: Once | ORAL | 0 refills | Status: CP
Start: 2020-02-21 — End: ?
  Administered 2020-02-21: 19:00:00 300 mg via ORAL

## 2020-02-21 MED ORDER — OXYCODONE 5 MG PO TAB
5-10 mg | Freq: Once | ORAL | 0 refills | Status: DC | PRN
Start: 2020-02-21 — End: 2020-02-21

## 2020-02-21 MED ORDER — MIDAZOLAM 1 MG/ML IJ SOLN
INTRAVENOUS | 0 refills | Status: DC
Start: 2020-02-21 — End: 2020-02-21
  Administered 2020-02-21: 20:00:00 2 mg via INTRAVENOUS

## 2020-02-21 MED ORDER — LIDOCAINE (PF) 10 MG/ML (1 %) IJ SOLN
.1-2 mL | INTRAMUSCULAR | 0 refills | Status: DC | PRN
Start: 2020-02-21 — End: 2020-02-21

## 2020-02-21 MED ORDER — BISACODYL 10 MG RE SUPP
10 mg | Freq: Every day | RECTAL | 0 refills | Status: DC | PRN
Start: 2020-02-21 — End: 2020-02-22

## 2020-02-21 MED ORDER — ONDANSETRON HCL (PF) 4 MG/2 ML IJ SOLN
4 mg | INTRAVENOUS | 0 refills | Status: DC | PRN
Start: 2020-02-21 — End: 2020-02-22
  Administered 2020-02-22: 17:00:00 4 mg via INTRAVENOUS

## 2020-02-21 MED ORDER — FENTANYL CITRATE (PF) 50 MCG/ML IJ SOLN
25-50 ug | INTRAVENOUS | 0 refills | Status: DC | PRN
Start: 2020-02-21 — End: 2020-02-22

## 2020-02-21 MED ORDER — BUPIVACAINE (PF) 0.75 % (7.5 MG/ML) IJ SOLN
INTRATHECAL | 0 refills | Status: CP
Start: 2020-02-21 — End: ?
  Administered 2020-02-21: 20:00:00 2 mL via INTRATHECAL

## 2020-02-21 MED ORDER — ASPIRIN 81 MG PO TBEC
81 mg | Freq: Two times a day (BID) | ORAL | 0 refills | Status: DC
Start: 2020-02-21 — End: 2020-02-22
  Administered 2020-02-22 (×2): 81 mg via ORAL

## 2020-02-21 MED ORDER — PROMETHAZINE 25 MG/ML IJ SOLN
6.25 mg | INTRAVENOUS | 0 refills | Status: DC | PRN
Start: 2020-02-21 — End: 2020-02-21

## 2020-02-21 MED ORDER — OXYCODONE 5 MG PO TAB
5-10 mg | ORAL_TABLET | ORAL | 0 refills | 6.00000 days | Status: DC | PRN
Start: 2020-02-21 — End: 2020-02-21

## 2020-02-21 MED ORDER — OXYCODONE 5 MG PO TAB
5-10 mg | ORAL | 0 refills | Status: DC | PRN
Start: 2020-02-21 — End: 2020-02-22
  Administered 2020-02-22 (×2): 10 mg via ORAL
  Administered 2020-02-22: 05:00:00 5 mg via ORAL
  Administered 2020-02-22: 09:00:00 10 mg via ORAL

## 2020-02-21 MED ORDER — SITAGLIPTIN 100 MG PO TAB
100 mg | Freq: Every day | ORAL | 0 refills | Status: DC
Start: 2020-02-21 — End: 2020-02-22
  Administered 2020-02-22: 19:00:00 100 mg via ORAL

## 2020-02-22 ENCOUNTER — Encounter: Admit: 2020-02-22 | Discharge: 2020-02-22 | Payer: No Typology Code available for payment source | Primary: Family

## 2020-02-22 MED ORDER — DOCUSATE SODIUM 100 MG PO CAP
100 mg | ORAL_CAPSULE | Freq: Two times a day (BID) | ORAL | 0 refills | Status: DC | PRN
Start: 2020-02-22 — End: 2020-05-02

## 2020-02-22 MED ORDER — TRAMADOL 50 MG PO TAB
50 mg | ORAL_TABLET | ORAL | 0 refills | Status: DC | PRN
Start: 2020-02-22 — End: 2020-05-02

## 2020-02-22 MED ORDER — ACETAMINOPHEN 325 MG PO TAB
650 mg | ORAL | 0 refills | Status: DC | PRN
Start: 2020-02-22 — End: 2020-05-02

## 2020-02-22 MED ORDER — ASPIRIN 81 MG PO TBEC
81 mg | ORAL_TABLET | Freq: Two times a day (BID) | ORAL | 0 refills | Status: CN
Start: 2020-02-22 — End: ?

## 2020-02-22 MED ADMIN — SODIUM CHLORIDE 0.9 % IJ SOLN [7319]: 10 mL | INTRAVENOUS | @ 04:00:00 | Stop: 2020-02-22 | NDC 00409488803

## 2020-02-22 MED ADMIN — SODIUM CHLORIDE 0.9 % IJ SOLN [7319]: 10 mL | INTRAVENOUS | @ 12:00:00 | Stop: 2020-02-22 | NDC 00409488803

## 2020-02-22 MED FILL — ASPIRIN 81 MG PO TBEC: 81 mg | ORAL | 30 days supply | Qty: 60 | Fill #1 | Status: CN

## 2020-02-23 ENCOUNTER — Encounter: Admit: 2020-02-23 | Discharge: 2020-02-23 | Payer: No Typology Code available for payment source | Primary: Family

## 2020-02-23 DIAGNOSIS — R079 Chest pain, unspecified: Secondary | ICD-10-CM

## 2020-02-23 DIAGNOSIS — C4491 Basal cell carcinoma of skin, unspecified: Secondary | ICD-10-CM

## 2020-02-23 DIAGNOSIS — E785 Hyperlipidemia, unspecified: Secondary | ICD-10-CM

## 2020-02-23 DIAGNOSIS — E119 Type 2 diabetes mellitus without complications: Secondary | ICD-10-CM

## 2020-02-25 ENCOUNTER — Encounter: Admit: 2020-02-25 | Discharge: 2020-02-25 | Payer: No Typology Code available for payment source | Primary: Family

## 2020-02-25 NOTE — Telephone Encounter
Calling for pt.  c/o some swelling from hip down to knee slightly. Pt has taken support hose off- encouraged pt to have them on for circulation and swelling. c/o constipation - No BM since surgery.  Instructed to use Senokot S - 1 tablet in am and 1 in pm and can increase to 4 tab in am and 4 in pm max for constipation. Alternative can use Miralax if preferred.  Pt has increased fluid intake, fairly good appetite and has been ambulating well.  Instructed to leave incisional dressing on for at least 10 days- states dressing is on and intact. Pt taking Tramadol and Tylenol for pain- does not want to take the Oxydocone unless necessary.  Verbalized understanding of these instructions and will call if any other problems occur.  This was interpreted to the pt by Natalia.

## 2020-02-26 ENCOUNTER — Encounter: Admit: 2020-02-26 | Discharge: 2020-02-26 | Payer: No Typology Code available for payment source | Primary: Family

## 2020-02-27 ENCOUNTER — Encounter: Admit: 2020-02-27 | Discharge: 2020-02-27 | Payer: No Typology Code available for payment source | Primary: Family

## 2020-03-03 ENCOUNTER — Encounter: Admit: 2020-03-03 | Discharge: 2020-03-03 | Payer: No Typology Code available for payment source | Primary: Family

## 2020-03-03 DIAGNOSIS — Z96642 Presence of left artificial hip joint: Secondary | ICD-10-CM

## 2020-03-03 NOTE — Progress Notes
Ariel Cajigal [phone] called for the patient.  The patient hasn't started OP PT.    The would like to use [facility] fax [phone] because, they speak Spanish.  CM e- mailed Olivia Mackie RN with Dr. Christoper Fabian office the information and requested she fax orders so the patient can make an appointment.

## 2020-03-05 ENCOUNTER — Ambulatory Visit: Admit: 2020-03-05 | Discharge: 2020-03-06 | Payer: No Typology Code available for payment source | Primary: Family

## 2020-03-05 ENCOUNTER — Encounter: Admit: 2020-03-05 | Discharge: 2020-03-05 | Payer: No Typology Code available for payment source | Primary: Family

## 2020-03-05 DIAGNOSIS — E785 Hyperlipidemia, unspecified: Secondary | ICD-10-CM

## 2020-03-05 DIAGNOSIS — Z9889 Other specified postprocedural states: Secondary | ICD-10-CM

## 2020-03-05 DIAGNOSIS — E119 Type 2 diabetes mellitus without complications: Secondary | ICD-10-CM

## 2020-03-05 DIAGNOSIS — C4491 Basal cell carcinoma of skin, unspecified: Secondary | ICD-10-CM

## 2020-03-05 DIAGNOSIS — R079 Chest pain, unspecified: Secondary | ICD-10-CM

## 2020-03-05 NOTE — Progress Notes
Orthopaedic Post-Operative Visit - Dora Sims, MD    Date of Visit: 03/05/20  _______________________________________________  DIAGNOSIS:   s/p Left total hip arthroplasty     PROCEDURE:   Left THA (02/21/20)     PLAN:  - Return to clinic in 4 weeks    ASSESSMENT:   Overall doing well.  Pain is well controlled.  There are no weight bearing restrictions.  Continue DVT ppx for a total of 1 month postop. I encouraged continued progression in ambulation. Return to clinic in 4 weeks, sooner if there are any concerns.  ________________________________________________    Interval Hx:  Patient returns for routine postoperative visit.  Ambulation and pain improving over the last couple weeks. Denies fevers, chills, persistent draining from wound, or erythema.      PHYSICAL EXAM:  Vitals:    03/05/20 1121   BP: 124/73   Pulse: 86   Resp: 18   Temp: 36.7 C (98.1 F)   SpO2: 98%       Incision benign, no draining, erythema  Mild tenderness at surgical site  Motor intact with knee flexion/extension  Distal pulses intact, skin is well perfused

## 2020-03-17 ENCOUNTER — Encounter: Admit: 2020-03-17 | Discharge: 2020-03-17 | Payer: No Typology Code available for payment source | Primary: Family

## 2020-03-17 ENCOUNTER — Ambulatory Visit: Admit: 2020-03-17 | Discharge: 2020-03-18 | Payer: No Typology Code available for payment source | Primary: Family

## 2020-03-17 DIAGNOSIS — N812 Incomplete uterovaginal prolapse: Secondary | ICD-10-CM

## 2020-03-17 DIAGNOSIS — E785 Hyperlipidemia, unspecified: Secondary | ICD-10-CM

## 2020-03-17 DIAGNOSIS — C4491 Basal cell carcinoma of skin, unspecified: Secondary | ICD-10-CM

## 2020-03-17 DIAGNOSIS — E119 Type 2 diabetes mellitus without complications: Secondary | ICD-10-CM

## 2020-03-17 DIAGNOSIS — R079 Chest pain, unspecified: Secondary | ICD-10-CM

## 2020-03-17 DIAGNOSIS — N952 Postmenopausal atrophic vaginitis: Secondary | ICD-10-CM

## 2020-03-17 MED ORDER — ESTRADIOL 0.01 % (0.1 MG/GRAM) VA CREA
1 g | VAGINAL | 11 refills | 43.00000 days | Status: AC
Start: 2020-03-17 — End: ?

## 2020-03-17 NOTE — Progress Notes
Pertinent Review of Systems:  - General ROS: Denies recent weight change above 10 pounds, Denies malaise  - HEENT ROS: Denies blurring of vision. Denies double vision, Denies glaucoma, Denies dry eyes  - Cardiovascular ROS: Denies chest pain. Denies palpitations currently. Denies orthopnea  - Respiratory ROS: Denies shortness of breath,  Denies cough,  Denies wheezing currently  - Gastrointestinal ROS: Denies constipation, Denies gastric reflux,  Denies blood in stool, Denies diarrhea, Denies Irritable bowel syndrome, Denies nausea, Denies vomiting, Denies abdominal pain  - Endocrine ROS: Denies polydipsia,  Denies excessive sweating. Denies hot flashes. Denies Thyroid disease  - Hematological and Lymphatic ROS: Denies easy bruisability , Denies current anemia, Denies adenopathy in the inguinal area  - Neurological ROS: Denies syncope, Denies numbness in lower extremities, Denies neuropathy. Denies shooting pain down the legs, Denies shooting pain in the lower back, Denies low back pain  - Musculoskeletal ROS: DeniesPOS Joint pain. Denies Edema in lower extremities. Denies fibromyalgia  - Psychological ROS: Denies depression, Denies anxiety, Denies thoughts of suicide, Denies thoughts of homicide, Denies recent seizure, Denies syncope   - Dermatological ROS: Denies eczema. Denies skin rashes in the groin area and other sites

## 2020-03-17 NOTE — Progress Notes
PESSARY CHECK    CC: Pessary Maintainance    HPI  Regina Dean is a 64 y.o. female here today for a pessary check. She has Stage 3 anterior-predominant uterovaginal prolapse and desires to continue conservative management with a pessary. She was last seen on 12/10/19 for a pessary check. She uses a 2 1/4 gellhorn pessary.     She underwent a left hip replacement on 02/21/20. She was able to take the pessary out for surgery and has tried to replace it but reports ever since she tried putting it back in the pessary is easily expelled when she goes to the bathroom. She noticed a small amount of vaginal bleeding earlier this week after she replaced the pessary and has left it out since then. She suspects she is not getting the pessary back in correctly, which she thinks may be related to her limited range of motion post-op.    She does use vaginal estrogen therapy.  She denies discharge. She denies irritation/pain.    She denies difficulty with urination. She does feel that she empties her bladder well.     She denies bowel symptoms.    Pertinent Review of Systems:  - General ROS: Denies recent weight change above 10 pounds, Denies malaise  - HEENT ROS: Denies blurring of vision. Denies double vision, Denies glaucoma, Denies dry eyes  - Cardiovascular ROS: Denies chest pain. Denies palpitations currently. Denies orthopnea  - Respiratory ROS: Denies shortness of breath,  Denies cough,  Denies wheezing currently  - Gastrointestinal ROS: Denies constipation, Denies gastric reflux,  Denies blood in stool, Denies diarrhea, Denies Irritable bowel syndrome, Denies nausea, Denies vomiting, Denies abdominal pain  - Endocrine ROS: Denies polydipsia,  Denies excessive sweating. Denies hot flashes. Denies Thyroid disease  - Hematological and Lymphatic ROS: Denies easy bruisability , Denies current anemia, Denies adenopathy in the inguinal area  - Neurological ROS: Denies syncope, Denies numbness in lower extremities, Denies neuropathy. Denies shooting pain down the legs, Denies shooting pain in the lower back, Denies low back pain  - Musculoskeletal ROS: POS Joint pain. Denies Edema in lower extremities. Denies fibromyalgia  - Psychological ROS: Denies depression, Denies anxiety, Denies thoughts of suicide, Denies thoughts of homicide, Denies recent seizure, Denies syncope   - Dermatological ROS: Denies eczema. Denies skin rashes in the groin area and other sites    Medical History:   Diagnosis Date   ? Basal cell carcinoma of skin, site unspecified    ? Chest pain    ? DM (diabetes mellitus) (HCC)    ? Hyperlipemia      Surgical History:   Procedure Laterality Date   ? LEFT TOTAL HIP ARTHROPLASTY Left 02/21/2020    Performed by Sula Rumple, MD at [facility]   ? HX TONSILLECTOMY     ? TUBAL LIGATION       (Not in a hospital admission)    Current Outpatient Medications   Medication Sig Dispense Refill   ? acetaminophen (TYLENOL) 325 mg tablet Take two tablets by mouth every 6 hours as needed for Pain. Indications: pain     ? aspirin EC 81 mg tablet Take one tablet by mouth twice daily for 30 days. Take with food. 60 tablet 0   ? atorvastatin (LIPITOR) 10 mg tablet Take 10 mg by mouth daily.     ? docusate (COLACE) 100 mg capsule Take one capsule by mouth twice daily as needed for Constipation. Indications: constipation 180 capsule    ?  FLAXSEED OIL (OMEGA 3 PO) Take  by mouth daily after lunch.     ? JANUVIA 100 mg tab tablet Take 100 mg by mouth daily after lunch.     ? metFORMIN (GLUCOPHAGE) 500 mg tablet Take 500 mg by mouth twice daily.     ? oxyCODONE (ROXICODONE) 5 mg tablet Take one tablet to two tablets by mouth every 4 hours as needed for Pain Indications: DX: G89.18 post op pain 40 tablet 0   ? traMADoL (ULTRAM) 50 mg tablet Take one tablet by mouth every 6 hours as needed. Indications: pain, DX: G89.18 post op pain 40 tablet 0   ? traMADoL (ULTRAM) 50 mg tablet Take one tablet by mouth every 6 hours as needed for Pain. 20 tablet 0     No current facility-administered medications for this visit.      No Known Allergies  Social History     Tobacco Use   ? Smoking status: Never Smoker   ? Smokeless tobacco: Never Used   Substance Use Topics   ? Alcohol use: No       Objective:   Vitals:    03/17/20 1455   BP: 114/67   Pulse: 74   Resp: 16   Temp: 36.6 ?C (97.9 ?F)   SpO2: 100%   Weight: 67.6 kg (149 lb)   Height: 172.7 cm (68)   PainSc: Zero     Physical Exam:  CONSTITUTIONAL:   Well developed.  Well nourished.   RESPIRATORY: Normal respiratory effort.   CARDIOVASCULAR:  Normal peripheral vascular exam.  ABDOMINAL:  Abdomen, soft and nontender.    SKIN:   Normal inspection.  MUSCULOSKELETAL: No deformities/weakness. Moves all extremities well.   NEUROLOGIC:  Normal reflexes.  PSYCHIATRIC:  Oriented X 3.    GENITOURINARY:  External Genitalia:  Normal appearance, no lesions.    Urethral Meatus: Normal size and location. and No lesions or prolapse.  Urethra:  No masses or tenderness.  Bladder:  No masses or tenderness.  Vagina: Atrophic changes noted, superficial erosion at vaginal apex, hemostatic but treated with silver nitrate  Cervix: No lesions.  Uterus: Mobile, nontender  Adnexa/Parametrial:  No palpable masses, no tenderness.   Perineal Sensation: Normal.    ASSESSMENT:  Regina Dean is a 64 y.o. female seen for pessary management of Stage 3 anterior-predominant uterovaginal prolapse. She is doing well and desires to continue conservative management with the pessary. A superficial erosion was noted at the vaginal apex today, which was treated with silver nitrate. The pessary was left out.      PLAN:    Prolapse:   - She desires to continue conservative management with a pessary while post-op from her hip replacement.?She may be interested in surgical management in the future.?  - Pessary holiday due to superficial erosion  - Return to clinic in 2 weeks, anticipate pessary replacement  ?  Vaginal atrophy:  - Continue vaginal estrogen cream, refilled today

## 2020-03-19 ENCOUNTER — Encounter: Admit: 2020-03-19 | Discharge: 2020-03-19 | Payer: No Typology Code available for payment source | Primary: Family

## 2020-03-19 ENCOUNTER — Ambulatory Visit: Admit: 2020-03-19 | Discharge: 2020-03-19 | Payer: No Typology Code available for payment source | Primary: Family

## 2020-03-19 DIAGNOSIS — E119 Type 2 diabetes mellitus without complications: Secondary | ICD-10-CM

## 2020-03-19 DIAGNOSIS — R079 Chest pain, unspecified: Secondary | ICD-10-CM

## 2020-03-19 DIAGNOSIS — M170 Bilateral primary osteoarthritis of knee: Secondary | ICD-10-CM

## 2020-03-19 DIAGNOSIS — E785 Hyperlipidemia, unspecified: Secondary | ICD-10-CM

## 2020-03-19 DIAGNOSIS — C4491 Basal cell carcinoma of skin, unspecified: Secondary | ICD-10-CM

## 2020-03-19 NOTE — Patient Instructions
It was our pleasure to see you today.  If you need anything further contact our office at [phone]  Luke Thompson MD & Marsha Gundlach RN at [facility].    You received an injection today which included the following medications: Kenalog & Ropivacaine     Please follow the appropriate instructions below:   Recurring pain  Injections are done using a local anesthetic.  The numbing effect typically lasts for approximately one hour and then wears off. Expect pain to return after the first hour and then diminish within 2-3 days.   Rest the area  Be careful with the injected area/joint. Do not overuse this area.  Do not use this area for more than mild activities.  It is also important to not splint or keep this area completely still, unless instructed to do so.   Watch for signs of infection  Although precautions were taken to prevent infection, be alert for signs of infection.  This includes: fever > 100 degrees farenheit, increased warmth or redness in the injected area, redness spreading from the injected site or swelling.  If any of these develop, please call Lucas Thompson, MD's office at [phone].   No excessive exercise for 48 hours. Normal daily activity is fine.    No heat for 48 hours. Warm showers are ok.   

## 2020-03-24 ENCOUNTER — Encounter: Admit: 2020-03-24 | Discharge: 2020-03-24 | Payer: No Typology Code available for payment source | Primary: Family

## 2020-03-24 DIAGNOSIS — M25551 Pain in right hip: Secondary | ICD-10-CM

## 2020-03-31 ENCOUNTER — Encounter: Admit: 2020-03-31 | Discharge: 2020-03-31 | Payer: No Typology Code available for payment source | Primary: Family

## 2020-03-31 ENCOUNTER — Ambulatory Visit: Admit: 2020-03-31 | Discharge: 2020-03-31 | Payer: No Typology Code available for payment source | Primary: Family

## 2020-03-31 DIAGNOSIS — E119 Type 2 diabetes mellitus without complications: Secondary | ICD-10-CM

## 2020-03-31 DIAGNOSIS — R079 Chest pain, unspecified: Secondary | ICD-10-CM

## 2020-03-31 DIAGNOSIS — C4491 Basal cell carcinoma of skin, unspecified: Secondary | ICD-10-CM

## 2020-03-31 DIAGNOSIS — M1711 Unilateral primary osteoarthritis, right knee: Secondary | ICD-10-CM

## 2020-03-31 DIAGNOSIS — M25551 Pain in right hip: Secondary | ICD-10-CM

## 2020-03-31 DIAGNOSIS — E785 Hyperlipidemia, unspecified: Secondary | ICD-10-CM

## 2020-03-31 NOTE — Patient Instructions
It was our pleasure to see you today.  If you need anything further contact our office at [phone]  Luke Thompson MD & Vergene Marland RN at [facility].

## 2020-04-01 ENCOUNTER — Encounter: Admit: 2020-04-01 | Discharge: 2020-04-01 | Payer: No Typology Code available for payment source | Primary: Family

## 2020-04-01 DIAGNOSIS — R079 Chest pain, unspecified: Secondary | ICD-10-CM

## 2020-04-01 DIAGNOSIS — C4491 Basal cell carcinoma of skin, unspecified: Secondary | ICD-10-CM

## 2020-04-01 DIAGNOSIS — E785 Hyperlipidemia, unspecified: Secondary | ICD-10-CM

## 2020-04-01 DIAGNOSIS — E119 Type 2 diabetes mellitus without complications: Secondary | ICD-10-CM

## 2020-04-02 ENCOUNTER — Encounter: Admit: 2020-04-02 | Discharge: 2020-04-02 | Payer: No Typology Code available for payment source | Primary: Family

## 2020-04-02 DIAGNOSIS — M1611 Unilateral primary osteoarthritis, right hip: Secondary | ICD-10-CM

## 2020-04-02 DIAGNOSIS — C4491 Basal cell carcinoma of skin, unspecified: Secondary | ICD-10-CM

## 2020-04-02 DIAGNOSIS — R079 Chest pain, unspecified: Secondary | ICD-10-CM

## 2020-04-02 DIAGNOSIS — E119 Type 2 diabetes mellitus without complications: Secondary | ICD-10-CM

## 2020-04-02 DIAGNOSIS — E785 Hyperlipidemia, unspecified: Secondary | ICD-10-CM

## 2020-04-02 NOTE — Progress Notes
Orthopaedic Surgery History and Physical - Jannette Fogo, MD    Referring Provider: Celso Sickle    Date of Visit: 04/02/20  _______________________________________________    DIAGNOSIS:   Status post left hip replacement  Right hip degenerative joint disease    PROCEDURES:  Left total hip replacement 02/21/2020    PLAN:   - Right total hip arthroplasty  - Anticoagulation: Aspirin  - F/u 2 weeks postop    ASSESSMENT:   The patient has end-stage degenerative joint disease which has not responded adequately to conservative management as documented below.  At this point a total hip arthroplasty would be the most reliable treatment for pain relief and improvement of function.  A discussion was had regarding the benefits and risks of THA.  These include but are not limited to infection, failure of hardware, damage to nerves and vessels, wound complications, need for revision, blood loss, loss of function, and continued hip pain.  The patient expressed understanding of the risks and would like to move forward with the procedure.  All questions were answered.        ________________________________________________    CHIEF COMPLAINT: Right hip pain     HISTORY OF PRESENT ILLNESS: Regina Dean is a 64 y.o. female who presents with right hip pain. The pain has been present for several months and is worsening.  She is seen quite a bit of improvement with the left hip and would like the right hip done as well.  X-rays from 03/31/2020 revealed severe degenerative changes in the right hip.    Failed conservative treatment measures include activity modification and corticosteroid injections, over-the-counter pain medications, and muscle relaxers.  The patient is unable to perform regular tasks or walk any real distance without having to stop because of pain.    PHYSICAL EXAM:  Vitals:    04/02/20 1002   BP: 127/72   Pulse: 77   Temp: 36.4 ?C (97.6 ?F)   SpO2: 99%       Musculoskeletal: Exam of right lower Extremity - Hip ROM reduced with pain when flexed and internally rotated   - Stinchfield positive   - Strength:  Hip flexion strength difficult to assess secondary to pain,   - Palpation: No tenderness to palpation over the greater trochanter      PAST MEDICAL HISTORY:   Medical History:   Diagnosis Date   ? Basal cell carcinoma of skin, site unspecified    ? Chest pain    ? DM (diabetes mellitus) (HCC)    ? Hyperlipemia        PAST SURGICAL HISTORY:   Surgical History:   Procedure Laterality Date   ? LEFT TOTAL HIP ARTHROPLASTY Left 02/21/2020    Performed by Sula Rumple, MD at [facility]   ? HX TONSILLECTOMY     ? TUBAL LIGATION         FAMILY HISTORY:   Family History   Problem Relation Age of Onset   ? Unknown to Patient Mother    ? Unknown to Patient Father        SOCIAL HISTORY:  reports that she has never smoked. She has never used smokeless tobacco. She reports that she does not drink alcohol or use drugs.    MEDICATIONS:   Current Outpatient Medications:   ?  acetaminophen (TYLENOL) 325 mg tablet, Take two tablets by mouth every 6 hours as needed for Pain. Indications: pain, Disp: , Rfl:   ?  atorvastatin (LIPITOR) 10 mg  tablet, Take 10 mg by mouth daily., Disp: , Rfl:   ?  docusate (COLACE) 100 mg capsule, Take one capsule by mouth twice daily as needed for Constipation. Indications: constipation, Disp: 180 capsule, Rfl:   ?  estradioL (ESTRACE) 0.01 % (0.1 mg/g) vaginal cream, Insert or Apply one g to vaginal area twice weekly., Disp: 42.5 g, Rfl: 11  ?  FLAXSEED OIL (OMEGA 3 PO), Take  by mouth daily after lunch., Disp: , Rfl:   ?  JANUVIA 100 mg tab tablet, Take 100 mg by mouth daily after lunch., Disp: , Rfl:   ?  metFORMIN (GLUCOPHAGE) 500 mg tablet, Take 500 mg by mouth twice daily., Disp: , Rfl:   ?  oxyCODONE (ROXICODONE) 5 mg tablet, Take one tablet to two tablets by mouth every 4 hours as needed for Pain Indications: DX: G89.18 post op pain, Disp: 40 tablet, Rfl: 0  ?  traMADoL (ULTRAM) 50 mg tablet, Take one tablet by mouth every 6 hours as needed. Indications: pain, DX: G89.18 post op pain, Disp: 40 tablet, Rfl: 0  ?  traMADoL (ULTRAM) 50 mg tablet, Take one tablet by mouth every 6 hours as needed for Pain., Disp: 20 tablet, Rfl: 0    ALLERGIES: No Known Allergies    IMAGING: Imaging reviewed by physician with findings as above

## 2020-04-04 ENCOUNTER — Encounter: Admit: 2020-04-04 | Discharge: 2020-04-04 | Payer: No Typology Code available for payment source | Primary: Family

## 2020-04-04 DIAGNOSIS — M1610 Unilateral primary osteoarthritis, unspecified hip: Secondary | ICD-10-CM

## 2020-04-04 DIAGNOSIS — Z20822 Encounter for screening laboratory testing for COVID-19 virus in asymptomatic patient: Secondary | ICD-10-CM

## 2020-04-04 DIAGNOSIS — M25559 Pain in unspecified hip: Secondary | ICD-10-CM

## 2020-04-04 MED ORDER — CEFAZOLIN INJ 1GM IVP
2 g | Freq: Once | INTRAVENOUS | 0 refills | Status: CN
Start: 2020-04-04 — End: ?

## 2020-04-04 NOTE — Telephone Encounter
Called and spoke with patient's daughter and confirmed surgery date of 05/01/20 at [facility].  Check in at the registration desk the day of surgery.  NPO after 11 pm the night prior to surgery.  COVID test scheduled for 5/4 at [place] and post op scheduled for 5/19.  She verbalized understanding and is familiar with the instructions because the patient recently had THA on her left side.

## 2020-04-07 ENCOUNTER — Encounter: Admit: 2020-04-07 | Discharge: 2020-04-07 | Payer: No Typology Code available for payment source | Primary: Family

## 2020-04-07 DIAGNOSIS — M25559 Pain in unspecified hip: Secondary | ICD-10-CM

## 2020-04-07 DIAGNOSIS — M1610 Unilateral primary osteoarthritis, unspecified hip: Secondary | ICD-10-CM

## 2020-04-14 ENCOUNTER — Encounter: Admit: 2020-04-14 | Discharge: 2020-04-14 | Payer: No Typology Code available for payment source | Primary: Family

## 2020-04-29 ENCOUNTER — Encounter: Admit: 2020-04-29 | Discharge: 2020-04-30 | Payer: No Typology Code available for payment source | Primary: Family

## 2020-04-29 ENCOUNTER — Encounter: Admit: 2020-04-29 | Discharge: 2020-04-29 | Payer: No Typology Code available for payment source | Primary: Family

## 2020-04-30 ENCOUNTER — Encounter: Admit: 2020-04-30 | Discharge: 2020-04-30 | Payer: No Typology Code available for payment source | Primary: Family

## 2020-05-01 ENCOUNTER — Encounter: Admit: 2020-05-01 | Discharge: 2020-05-01 | Payer: No Typology Code available for payment source | Primary: Family

## 2020-05-01 MED ORDER — CELECOXIB 200 MG PO CAP
200 mg | Freq: Once | ORAL | 0 refills | Status: CP
Start: 2020-05-01 — End: ?
  Administered 2020-05-01: 16:00:00 200 mg via ORAL

## 2020-05-01 MED ORDER — TRANEXAMIC ACID IN NACL,ISO-OS 1,000 MG/100 ML (10 MG/ML) IV PGBK
0 refills | Status: DC
Start: 2020-05-01 — End: 2020-05-01
  Administered 2020-05-01 (×2): 1 g via INTRAVENOUS

## 2020-05-01 MED ORDER — ONDANSETRON HCL (PF) 4 MG/2 ML IJ SOLN
4 mg | INTRAVENOUS | 0 refills | Status: DC | PRN
Start: 2020-05-01 — End: 2020-05-02
  Administered 2020-05-02: 15:00:00 4 mg via INTRAVENOUS

## 2020-05-01 MED ORDER — ACETAMINOPHEN 325 MG PO TAB
650 mg | Freq: Four times a day (QID) | ORAL | 0 refills | Status: DC
Start: 2020-05-01 — End: 2020-05-02
  Administered 2020-05-01 – 2020-05-02 (×4): 650 mg via ORAL

## 2020-05-01 MED ORDER — ASPIRIN 81 MG PO TBEC
81 mg | Freq: Two times a day (BID) | ORAL | 0 refills | Status: DC
Start: 2020-05-01 — End: 2020-05-02
  Administered 2020-05-02 (×2): 81 mg via ORAL

## 2020-05-01 MED ORDER — HYDROMORPHONE (PF) 2 MG/ML IJ SYRG
.5-1 mg | INTRAVENOUS | 0 refills | Status: DC | PRN
Start: 2020-05-01 — End: 2020-05-01

## 2020-05-01 MED ORDER — VANCOMYCIN 1,000 MG IV SOLR
0 refills | Status: DC
Start: 2020-05-01 — End: 2020-05-01
  Administered 2020-05-01: 20:00:00 1 g via TOPICAL

## 2020-05-01 MED ORDER — ATORVASTATIN 10 MG PO TAB
10 mg | Freq: Every day | ORAL | 0 refills | Status: CN
Start: 2020-05-01 — End: ?

## 2020-05-01 MED ORDER — ATORVASTATIN 10 MG PO TAB
10 mg | Freq: Every day | ORAL | 0 refills | Status: DC
Start: 2020-05-01 — End: 2020-05-02
  Administered 2020-05-02: 14:00:00 10 mg via ORAL

## 2020-05-01 MED ORDER — MAGNESIUM HYDROXIDE 2,400 MG/10 ML PO SUSP
10 mL | Freq: Every day | ORAL | 0 refills | Status: DC
Start: 2020-05-01 — End: 2020-05-02
  Administered 2020-05-02: 02:00:00 10 mL via ORAL

## 2020-05-01 MED ORDER — OXYCODONE 5 MG PO TAB
5-10 mg | ORAL | 0 refills | Status: DC | PRN
Start: 2020-05-01 — End: 2020-05-02
  Administered 2020-05-02: 02:00:00 5 mg via ORAL
  Administered 2020-05-02: 10:00:00 10 mg via ORAL
  Administered 2020-05-02: 03:00:00 5 mg via ORAL
  Administered 2020-05-02 (×2): 10 mg via ORAL

## 2020-05-01 MED ORDER — ACETAMINOPHEN 500 MG PO TAB
1000 mg | Freq: Once | ORAL | 0 refills | Status: CP
Start: 2020-05-01 — End: ?
  Administered 2020-05-01: 16:00:00 1000 mg via ORAL

## 2020-05-01 MED ORDER — PHENYLEPHRINE HCL IN 0.9% NACL 1 MG/10 ML (100 MCG/ML) IV SYRG
INTRAVENOUS | 0 refills | Status: DC
Start: 2020-05-01 — End: 2020-05-01
  Administered 2020-05-01: 19:00:00 100 ug via INTRAVENOUS

## 2020-05-01 MED ORDER — PROPOFOL 10 MG/ML IV EMUL 100 ML (INFUSION)(AM)(OR)
0 refills | Status: DC
Start: 2020-05-01 — End: 2020-05-01
  Administered 2020-05-01: 19:00:00 80 ug/kg/min via INTRAVENOUS

## 2020-05-01 MED ORDER — ROPIVACAINE-EPI-CLONID-KETOROL 2.46-0.005- 0.0008-0.3MG/ML PATC SYRG
0 refills | Status: DC
Start: 2020-05-01 — End: 2020-05-01
  Administered 2020-05-01: 20:00:00 100 mL

## 2020-05-01 MED ORDER — SITAGLIPTIN 100 MG PO TAB
100 mg | Freq: Every day | ORAL | 0 refills | Status: CN
Start: 2020-05-01 — End: ?

## 2020-05-01 MED ORDER — FENTANYL CITRATE (PF) 50 MCG/ML IJ SOLN
25-50 ug | INTRAVENOUS | 0 refills | Status: DC | PRN
Start: 2020-05-01 — End: 2020-05-02
  Administered 2020-05-02: 08:00:00 50 ug via INTRAVENOUS
  Administered 2020-05-02: 04:00:00 25 ug via INTRAVENOUS

## 2020-05-01 MED ORDER — BUPIVACAINE (PF) 0.5 % (5 MG/ML) IJ SOLN
0 refills | Status: DC
Start: 2020-05-01 — End: 2020-05-01
  Administered 2020-05-01: 19:00:00 3 mL via INTRASPINAL

## 2020-05-01 MED ORDER — MIDAZOLAM 1 MG/ML IJ SOLN
INTRAVENOUS | 0 refills | Status: DC
Start: 2020-05-01 — End: 2020-05-01
  Administered 2020-05-01: 19:00:00 1 mg via INTRAVENOUS

## 2020-05-01 MED ORDER — CEFAZOLIN INJ 1GM IVP
1 g | INTRAVENOUS | 0 refills | Status: CP
Start: 2020-05-01 — End: ?
  Administered 2020-05-02 (×2): 1 g via INTRAVENOUS

## 2020-05-01 MED ORDER — LACTATED RINGERS IV SOLP
INTRAVENOUS | 0 refills | Status: DC
Start: 2020-05-01 — End: 2020-05-01
  Administered 2020-05-01: 16:00:00 1000.000 mL via INTRAVENOUS

## 2020-05-01 MED ORDER — FENTANYL CITRATE (PF) 50 MCG/ML IJ SOLN
0 refills | Status: DC
Start: 2020-05-01 — End: 2020-05-01
  Administered 2020-05-01 (×2): 50 ug via INTRAVENOUS

## 2020-05-01 MED ORDER — CEFAZOLIN INJ 1GM IVP
2 g | Freq: Once | INTRAVENOUS | 0 refills | Status: CP
Start: 2020-05-01 — End: ?
  Administered 2020-05-01: 19:00:00 2 g via INTRAVENOUS

## 2020-05-01 MED ORDER — BISACODYL 10 MG RE SUPP
10 mg | Freq: Every day | RECTAL | 0 refills | Status: DC | PRN
Start: 2020-05-01 — End: 2020-05-02

## 2020-05-01 MED ORDER — INSULIN ASPART 100 UNIT/ML SC FLEXPEN
0-12 [IU] | Freq: Before meals | SUBCUTANEOUS | 0 refills | Status: DC
Start: 2020-05-01 — End: 2020-05-02

## 2020-05-01 MED ORDER — DOCUSATE SODIUM 100 MG PO CAP
100 mg | Freq: Two times a day (BID) | ORAL | 0 refills | Status: DC
Start: 2020-05-01 — End: 2020-05-02
  Administered 2020-05-02 (×2): 100 mg via ORAL

## 2020-05-01 MED ORDER — SITAGLIPTIN 100 MG PO TAB
100 mg | Freq: Every day | ORAL | 0 refills | Status: DC
Start: 2020-05-01 — End: 2020-05-02

## 2020-05-01 MED ORDER — EPHEDRINE SULFATE 50 MG/ML IV SOLN
0 refills | Status: DC
Start: 2020-05-01 — End: 2020-05-01
  Administered 2020-05-01 (×2): 10 mg via INTRAVENOUS

## 2020-05-01 MED ORDER — OXYCODONE 5 MG PO TAB
5-10 mg | Freq: Once | ORAL | 0 refills | Status: DC | PRN
Start: 2020-05-01 — End: 2020-05-01

## 2020-05-01 MED ORDER — HYDROMORPHONE (PF) 2 MG/ML IJ SYRG
.5 mg | INTRAVENOUS | 0 refills | Status: DC | PRN
Start: 2020-05-01 — End: 2020-05-01

## 2020-05-01 MED ORDER — LACTATED RINGERS IV SOLP
INTRAVENOUS | 0 refills | Status: DC
Start: 2020-05-01 — End: 2020-05-02
  Administered 2020-05-01: 22:00:00 1000.000 mL via INTRAVENOUS

## 2020-05-01 MED ORDER — METFORMIN 850 MG PO TAB
850 mg | Freq: Two times a day (BID) | ORAL | 0 refills | Status: CN
Start: 2020-05-01 — End: ?

## 2020-05-01 MED ORDER — FENTANYL CITRATE (PF) 50 MCG/ML IJ SOLN
25 ug | INTRAVENOUS | 0 refills | Status: DC | PRN
Start: 2020-05-01 — End: 2020-05-01

## 2020-05-01 MED ORDER — LIDOCAINE (PF) 10 MG/ML (1 %) IJ SOLN
.1-2 mL | INTRAMUSCULAR | 0 refills | Status: DC | PRN
Start: 2020-05-01 — End: 2020-05-01

## 2020-05-01 MED ORDER — ALUMINUM-MAGNESIUM HYDROXIDE 200-200 MG/5 ML PO SUSP
30 mL | ORAL | 0 refills | Status: DC | PRN
Start: 2020-05-01 — End: 2020-05-02

## 2020-05-01 NOTE — Anesthesia Post-Procedure Evaluation
Post-Anesthesia Evaluation    Name: Regina Dean      MRN: [id number]     DOB: 1956-10-25     Age: 64 y.o.     Sex: female   __________________________________________________________________________     Procedure Information     Anesthesia Start Date/Time: 05/01/20 1334    Procedure: RIGHT TOTAL HIP ARTHROPLASTY (Right Hip)    Location: [facility] / [facility]    Surgeons: Sula Rumple, MD          Post-Anesthesia Vitals  BP: 106/61 (05/06 1830)  Temp: 36.3 ?C (97.3 ?F) (05/06 1659)  Pulse: 58 (05/06 1645)  Respirations: 16 PER MINUTE (05/06 1659)  SpO2: 97 % (05/06 1830)  SpO2 Pulse: 86 (05/06 1830)  Height: 167.6 cm (66) (05/06 1011)   Vitals Value Taken Time   BP 110/69 05/01/20 1645   Temp 36.1 ?C (96.9 ?F) 05/01/20 1645   Pulse 58 05/01/20 1645   Respirations 13 PER MINUTE 05/01/20 1645   SpO2 98 % 05/01/20 1645         Post Anesthesia Evaluation Note    Evaluation location: pre/post  Patient participation: recovered; patient participated in evaluation  Level of consciousness: alert    Pain score: 0  Pain management: adequate    Hydration: normovolemia  Temperature: 36.0?C - 38.4?C  Airway patency: adequate    Regional/Neuraxial:       Neurological status: block resolved      Single injection shot performed    Perioperative Events       Post-op nausea and vomiting: no PONV    Postoperative Status  Cardiovascular status: hemodynamically stable  Respiratory status: spontaneous ventilation        Perioperative Events  Perioperative Event: No  Emergency Case Activation: No

## 2020-05-02 ENCOUNTER — Encounter: Admit: 2020-05-02 | Discharge: 2020-05-02 | Payer: No Typology Code available for payment source | Primary: Family

## 2020-05-02 MED ORDER — SODIUM CHLORIDE 0.9 % FLUSH
3-5 mL | Freq: Three times a day (TID) | 0 refills | Status: DC
Start: 2020-05-02 — End: 2020-05-02

## 2020-05-02 MED ORDER — TRAMADOL 50 MG PO TAB
50-100 mg | ORAL_TABLET | ORAL | 0 refills | Status: AC | PRN
Start: 2020-05-02 — End: ?
  Filled 2020-05-02: qty 30, 4d supply, fill #1

## 2020-05-02 MED ORDER — TRAMADOL 50 MG PO TAB
50-100 mg | ORAL | 0 refills | Status: DC | PRN
Start: 2020-05-02 — End: 2020-05-02
  Administered 2020-05-02: 18:00:00 50 mg via ORAL

## 2020-05-02 MED ORDER — ACETAMINOPHEN 325 MG PO TAB
650 mg | ORAL_TABLET | Freq: Four times a day (QID) | ORAL | 0 refills | Status: CN
Start: 2020-05-02 — End: ?

## 2020-05-02 MED ORDER — ONDANSETRON 4 MG PO TBDI
4 mg | ORAL_TABLET | ORAL | 0 refills | 8.00000 days | Status: AC | PRN
Start: 2020-05-02 — End: ?
  Filled 2020-05-02: qty 30, 30d supply, fill #1

## 2020-05-02 MED ORDER — OXYCODONE 5 MG PO TAB
5-10 mg | ORAL_TABLET | ORAL | 0 refills | 6.00000 days | Status: AC | PRN
Start: 2020-05-02 — End: ?
  Filled 2020-05-02: qty 40, 4d supply, fill #1

## 2020-05-02 MED ORDER — DOCUSATE SODIUM 100 MG PO CAP
100 mg | ORAL_CAPSULE | Freq: Two times a day (BID) | ORAL | 0 refills | Status: CN | PRN
Start: 2020-05-02 — End: ?

## 2020-05-02 MED ORDER — TRAMADOL 50 MG PO TAB
50-100 mg | ORAL_TABLET | ORAL | 0 refills | Status: AC | PRN
Start: 2020-05-02 — End: ?

## 2020-05-02 MED ORDER — ASPIRIN 81 MG PO TBEC
81 mg | ORAL_TABLET | Freq: Two times a day (BID) | ORAL | 0 refills | Status: AC
Start: 2020-05-02 — End: ?
  Filled 2020-05-02: qty 90, 30d supply, fill #1

## 2020-05-02 MED ADMIN — SODIUM CHLORIDE 0.9 % IJ SOLN [7319]: 10 mL | INTRAVENOUS | @ 02:00:00 | Stop: 2020-05-02 | NDC 00409488803

## 2020-05-02 MED ADMIN — SODIUM CHLORIDE 0.9 % IJ SOLN [7319]: 10 mL | INTRAVENOUS | @ 10:00:00 | Stop: 2020-05-02 | NDC 00409488803

## 2020-05-03 ENCOUNTER — Encounter: Admit: 2020-05-03 | Discharge: 2020-05-03 | Payer: No Typology Code available for payment source | Primary: Family

## 2020-05-03 DIAGNOSIS — R079 Chest pain, unspecified: Secondary | ICD-10-CM

## 2020-05-03 DIAGNOSIS — E785 Hyperlipidemia, unspecified: Secondary | ICD-10-CM

## 2020-05-03 DIAGNOSIS — C4491 Basal cell carcinoma of skin, unspecified: Secondary | ICD-10-CM

## 2020-05-03 DIAGNOSIS — E119 Type 2 diabetes mellitus without complications: Secondary | ICD-10-CM

## 2020-05-14 ENCOUNTER — Encounter: Admit: 2020-05-14 | Discharge: 2020-05-14 | Payer: No Typology Code available for payment source | Primary: Family

## 2020-05-19 ENCOUNTER — Encounter: Admit: 2020-05-19 | Discharge: 2020-05-19 | Payer: No Typology Code available for payment source | Primary: Family

## 2020-05-19 DIAGNOSIS — M25551 Pain in right hip: Secondary | ICD-10-CM

## 2020-05-22 ENCOUNTER — Encounter: Admit: 2020-05-22 | Discharge: 2020-05-22 | Payer: No Typology Code available for payment source | Primary: Family

## 2020-05-22 DIAGNOSIS — C4491 Basal cell carcinoma of skin, unspecified: Secondary | ICD-10-CM

## 2020-05-22 DIAGNOSIS — E119 Type 2 diabetes mellitus without complications: Secondary | ICD-10-CM

## 2020-05-22 DIAGNOSIS — R079 Chest pain, unspecified: Secondary | ICD-10-CM

## 2020-05-22 DIAGNOSIS — E785 Hyperlipidemia, unspecified: Secondary | ICD-10-CM

## 2020-05-22 MED ORDER — ROPIVACAINE (PF) 5 MG/ML (0.5 %) IJ SOLN
3 mL | Freq: Once | INTRAMUSCULAR | 0 refills | Status: CP | PRN
Start: 2020-05-22 — End: ?

## 2020-05-22 MED ORDER — TRIAMCINOLONE ACETONIDE 40 MG/ML IJ SUSP
40 mg | Freq: Once | INTRAMUSCULAR | 0 refills | Status: CP | PRN
Start: 2020-05-22 — End: ?

## 2020-07-14 ENCOUNTER — Encounter: Admit: 2020-07-14 | Discharge: 2020-07-14 | Payer: No Typology Code available for payment source | Primary: Family

## 2020-07-14 MED ORDER — PEG-ELECTROLYTE SOLN 420 GRAM PO SOLR
Freq: Once | 0 refills | Status: AC
Start: 2020-07-14 — End: ?

## 2020-07-15 ENCOUNTER — Encounter: Admit: 2020-07-15 | Discharge: 2020-07-15 | Payer: No Typology Code available for payment source | Primary: Family

## 2020-07-15 MED ORDER — SIMETHICONE 40 MG/0.6 ML PO DRPS
0 refills | Status: DC
Start: 2020-07-15 — End: 2020-07-20

## 2020-07-15 MED ORDER — OXYCODONE 5 MG PO TAB
5-10 mg | Freq: Once | ORAL | 0 refills | Status: AC | PRN
Start: 2020-07-15 — End: ?

## 2020-07-15 MED ORDER — MEPERIDINE (PF) 25 MG/ML IJ SYRG
12.5 mg | INTRAVENOUS | 0 refills | Status: DC | PRN
Start: 2020-07-15 — End: 2020-07-20

## 2020-07-15 MED ORDER — ONDANSETRON HCL (PF) 4 MG/2 ML IJ SOLN
4 mg | Freq: Once | INTRAVENOUS | 0 refills | Status: AC | PRN
Start: 2020-07-15 — End: ?

## 2020-07-15 MED ORDER — FENTANYL CITRATE (PF) 50 MCG/ML IJ SOLN
25 ug | INTRAVENOUS | 0 refills | Status: DC | PRN
Start: 2020-07-15 — End: 2020-07-20

## 2020-07-15 MED ORDER — FENTANYL CITRATE (PF) 50 MCG/ML IJ SOLN
50 ug | INTRAVENOUS | 0 refills | Status: DC | PRN
Start: 2020-07-15 — End: 2020-07-20

## 2020-07-15 MED ORDER — LACTATED RINGERS IV SOLP
1000 mL | INTRAVENOUS | 0 refills | Status: DC
Start: 2020-07-15 — End: 2020-07-20

## 2020-07-15 MED ORDER — PROPOFOL 10 MG/ML IV EMUL 20 ML (INFUSION)(AM)(OR)
INTRAVENOUS | 0 refills | Status: DC
Start: 2020-07-15 — End: 2020-07-15

## 2020-07-15 MED ORDER — PROMETHAZINE 25 MG/ML IJ SOLN
6.25 mg | INTRAVENOUS | 0 refills | Status: DC | PRN
Start: 2020-07-15 — End: 2020-07-20

## 2020-07-15 MED ORDER — HALOPERIDOL LACTATE 5 MG/ML IJ SOLN
1 mg | Freq: Once | INTRAVENOUS | 0 refills | Status: AC | PRN
Start: 2020-07-15 — End: ?

## 2020-07-15 MED ORDER — PROPOFOL INJ 10 MG/ML IV VIAL
INTRAVENOUS | 0 refills | Status: DC
Start: 2020-07-15 — End: 2020-07-15

## 2020-07-15 MED ORDER — LIDOCAINE (PF) 100 MG/5 ML (2 %) IV SYRG
INTRAVENOUS | 0 refills | Status: DC
Start: 2020-07-15 — End: 2020-07-15

## 2020-07-15 NOTE — Anesthesia Pre-Procedure Evaluation
Anesthesia Pre-Procedure Evaluation    Name: Regina Dean      MRN: [id number]     DOB: 1956-05-26     Age: 64 y.o.     Sex: female   _________________________________________________________________________     Procedure Info:   Procedure Information     Date/Time: 07/15/20 1318    Procedure: COLONOSCOPY DIAGNOSTIC WITH SPECIMEN COLLECTION BY BRUSHING/ WASHING - FLEXIBLE (N/A )    Location: [facility] / [facility]    Surgeons: Samuel Jester, MD          Physical Assessment  Vital Signs (last filed in past 24 hours):         Patient History   No Known Allergies     Current Medications    Medication Directions   acetaminophen (TYLENOL) 325 mg tablet Take two tablets by mouth four times daily.   aspirin EC 81 mg tablet Take one tablet by mouth twice daily. Take with food.   atorvastatin (LIPITOR) 10 mg tablet Take 10 mg by mouth daily.   docusate (COLACE) 100 mg capsule Take one capsule by mouth twice daily as needed for Constipation.   estradioL (ESTRACE) 0.01 % (0.1 mg/g) vaginal cream Insert or Apply one g to vaginal area twice weekly.   FLAXSEED OIL (OMEGA 3 PO) Take  by mouth daily after lunch.   JANUVIA 100 mg tab tablet Take 100 mg by mouth daily after lunch.   meloxicam (MOBIC) 15 mg tablet Take 15 mg by mouth daily before breakfast.   metFORMIN (GLUCOPHAGE) 850 mg tablet Take 850 mg by mouth twice daily with meals.   ondansetron (ZOFRAN ODT) 4 mg rapid dissolve tablet Dissolve one tablet by mouth every 8 hours as needed for Nausea or Vomiting. Place on tongue to dissolve.   oxyCODONE (ROXICODONE) 5 mg tablet Take one tablet to two tablets by mouth every 4 hours as needed for Pain   peg-electrolyte solution (NULYTELY) 420 gram oral solution Mix as directed on package.  Refrigerate once mixed. Do not mix more than 24 hours before procedure.   traMADoL (ULTRAM) 50 mg tablet Take one tablet to two tablets by mouth every 6 hours as needed.   traMADoL (ULTRAM) 50 mg tablet Take one tablet to two tablets by mouth every 6 hours as needed for Pain.     Medical History:   Diagnosis Date   ? Basal cell carcinoma of skin, site unspecified    ? Chest pain    ? DM (diabetes mellitus) (HCC)    ? Hyperlipemia      Surgical History:   Procedure Laterality Date   ? LEFT TOTAL HIP ARTHROPLASTY Left 02/21/2020    Performed by Sula Rumple, MD at [facility]   ? RIGHT TOTAL HIP ARTHROPLASTY Right 05/01/2020    Performed by Sula Rumple, MD at [facility]   ? HX TONSILLECTOMY     ? TUBAL LIGATION       Social History     Tobacco Use   ? Smoking status: Never Smoker   ? Smokeless tobacco: Never Used   Substance Use Topics   ? 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 )      Beta Blocker therapy: No      Beta blockers within 24 hours: n/a      No hypertension,       No valvular problems/murmurs      No past MI,       No palpitations      No dysrhythmias      No angina      No DVT      Hyperlipidemia (taking statin)      No orthopnea      No syncope      GI/Hepatic/Renal         No GERD,       No hx of liver disease     No renal disease      No electrolyte problems      Bowel prep      No nausea      No vomiting      Neuro/Psych       No seizures      No hx TIA      No CVA      No indications/hx of neuropathy      No indications/hx of sensory deficit      No psychiatric history      Musculoskeletal         No neck pain      No back pain      Arthritis (OA - L hip, bilateral knees )      Endocrine/Other       Diabetes, well controlled, type 2      No hypothyroidism      No anemia      Blood dyscrasia (BCC s/p excision )      No autoimmune disease      Malignancy (BCC - nose )    Constitution - negative   Physical Exam    Airway Findings      Mallampati: II      TM distance: <3 FB      Neck ROM: full      Mouth opening: good      Airway patency: adequate      Comments: Airway assessment deferred     Dental Findings:             Cardiovascular Findings: Negative      Rhythm: regular      Rate: normal      No murmur, no carotid bruit, no peripheral edema    Pulmonary Findings: Negative      Breath sounds clear to auscultation.    Abdominal Findings: Negative      Not obese    Neurological Findings: Negative      Alert and oriented x 3    Constitutional findings: Negative      No acute distress      Well-developed      Well-nourished  Diagnostic Tests  Hematology:   Lab Results   Component Value Date    HGB 10.5 05/02/2020    HCT 32.3 05/02/2020    PLTCT 146 05/02/2020    WBC 5.7 05/02/2020    NEUT 57 02/13/2020    ANC 3.07 02/13/2020    ALC 1.84 02/13/2020    MONA 6 02/13/2020    AMC 0.33 02/13/2020    EOSA 1 02/13/2020    ABC 0.08 02/13/2020    MCV 83.7 05/02/2020    MCH 27.2 05/02/2020    MCHC 32.5 05/02/2020    MPV 10.0 05/02/2020    RDW 14.5 05/02/2020         General Chemistry:   Lab Results   Component Value Date    NA 139 05/02/2020    K 3.8 05/02/2020    CL 103 05/02/2020    CO2 27 05/02/2020    GAP 9 05/02/2020    BUN 10 05/02/2020    CR 0.40 05/02/2020    GLU 164 05/02/2020    CA 8.9 05/02/2020    ALBUMIN 4.7 02/13/2020    TOTBILI 0.4 02/13/2020      Coagulation:   Lab Results   Component Value Date    INR 1.0 02/13/2020         Anesthesia Plan    ASA score: 3   Plan: MAC  Induction method: intravenous  NPO status: acceptable      Informed Consent  Anesthetic plan and risks discussed with patient.    Special considerations: Jehovah's Witness.      Plan discussed with: anesthesiologist, CRNA and surgeon/proceduralist.

## 2020-07-15 NOTE — Anesthesia Post-Procedure Evaluation
Post-Anesthesia Evaluation    Name: Regina Dean      MRN: [id number]     DOB: 10/19/1956     Age: 64 y.o.     Sex: female   __________________________________________________________________________     Procedure Information     Anesthesia Start Date/Time: 07/15/20 1339    Procedures:       COLONOSCOPY DIAGNOSTIC WITH SPECIMEN COLLECTION BY BRUSHING/ WASHING - FLEXIBLE (N/A )      COLONOSCOPY WITH SNARE REMOVAL TUMOR/ POLYP/ OTHER LESION (N/A )    [facility]    Surgeons: Samuel Jester, MD          Post-Anesthesia Vitals  BP: 125/67 (07/20 1420)  Temp: 36.4 ?C (97.5 ?F) (07/20 1411)  Pulse: 65 (07/20 1420)  Respirations: 15 PER MINUTE (07/20 1420)  SpO2: 100 % (07/20 1420)  SpO2 Pulse: 65 (07/20 1420)  Height: 167.6 cm (66) (07/20 1235)   Vitals Value Taken Time   BP 125/67 07/15/20 1420   Temp 36.4 ?C (97.5 ?F) 07/15/20 1411   Pulse 65 07/15/20 1420   Respirations 15 PER MINUTE 07/15/20 1420   SpO2 100 % 07/15/20 1420         Post Anesthesia Evaluation Note    Evaluation location: pre/post  Patient participation: recovered; patient participated in evaluation  Level of consciousness: alert    Pain score: 0  Pain management: adequate    Hydration: normovolemia  Temperature: 36.0?C - 38.4?C  Airway patency: adequate    Perioperative Events       Post-op nausea and vomiting: no PONV    Postoperative Status  Cardiovascular status: hemodynamically stable  Respiratory status: spontaneous ventilation  Follow-up needed: none        Perioperative Events  Perioperative Event: No  Emergency Case Activation: No

## 2020-07-16 ENCOUNTER — Encounter: Admit: 2020-07-16 | Discharge: 2020-07-16 | Payer: No Typology Code available for payment source | Primary: Family

## 2020-07-16 DIAGNOSIS — C4491 Basal cell carcinoma of skin, unspecified: Secondary | ICD-10-CM

## 2020-07-16 DIAGNOSIS — E785 Hyperlipidemia, unspecified: Secondary | ICD-10-CM

## 2020-07-16 DIAGNOSIS — E119 Type 2 diabetes mellitus without complications: Secondary | ICD-10-CM

## 2020-07-16 DIAGNOSIS — R079 Chest pain, unspecified: Secondary | ICD-10-CM

## 2020-08-18 ENCOUNTER — Ambulatory Visit: Admit: 2020-08-18 | Discharge: 2020-08-18 | Payer: No Typology Code available for payment source | Primary: Family

## 2020-08-18 ENCOUNTER — Encounter: Admit: 2020-08-18 | Discharge: 2020-08-18 | Payer: No Typology Code available for payment source | Primary: Family

## 2020-08-18 DIAGNOSIS — Z96641 Presence of right artificial hip joint: Secondary | ICD-10-CM

## 2020-08-18 DIAGNOSIS — E785 Hyperlipidemia, unspecified: Secondary | ICD-10-CM

## 2020-08-18 DIAGNOSIS — R079 Chest pain, unspecified: Secondary | ICD-10-CM

## 2020-08-18 DIAGNOSIS — Z9889 Other specified postprocedural states: Secondary | ICD-10-CM

## 2020-08-18 DIAGNOSIS — Z96642 Presence of left artificial hip joint: Secondary | ICD-10-CM

## 2020-08-18 DIAGNOSIS — C4491 Basal cell carcinoma of skin, unspecified: Secondary | ICD-10-CM

## 2020-08-18 DIAGNOSIS — E119 Type 2 diabetes mellitus without complications: Secondary | ICD-10-CM

## 2020-08-18 NOTE — Progress Notes
Orthopaedic Post-Operative Visit - Hattie Perch, APRN  Date of Visit: 08/18/20  _______________________________________________  DIAGNOSIS:   s/p  total hip arthroplasty     PROCEDURE:   Left THA (02/21/20)  Right THA 05/01/20     PLAN:  - Return to clinic in February of 2022 with new AP pelvis    ASSESSMENT:   Patient's daughter provided interpretation during this appointment.    Regina Dean returns to the clinic 3 months status post right total hip arthroplasty and 6 months status post left total hip arthroplasty.  She is doing well overall. Preoperative pain has resolved and the patient has returned to most activities.  She works as a Advertising copywriter and is limited in activities that require her to be down on her knees for prolonged period of time or carrying heavy cleaning equipment upstairs.  She is not limited by pain so much as she feels that she has a lack of strength to do these activities.  We discussed physical therapy for strengthening exercises but she is not interested at this time as she has already completed physical therapy. The patient is pleased with the outcome of both hip replacements up to this point. XRs from today reveal no change in implant alignment from immediate postoperative films. There is no evidence of hardware complication. Nothing concerning at today's visit.  We will have her return to the clinic in late February or early March as this will be the 1 year anniversary of her left total hip replacement.  She will have new hip films at that time.  She will follow up sooner should she have any concerns.  ________________________________________________    Interval Hx:  Patient returns for routine postoperative visit.  Ambulation and pain improving over the last couple weeks. Denies fevers, chills, persistent draining from wound, or erythema.      PHYSICAL EXAM:  Vitals:    08/18/20 1419   BP: 133/74   Pulse: 83   Resp: 18   Temp: 36.6 ?C (97.9 ?F)   SpO2: 97%     Patient is ambulating without assistive device  Incision benign, no draining, erythema  Surgical sites nontender  Motor intact with knee flexion/extension  Distal pulses intact, skin is well perfused

## 2021-01-19 NOTE — Progress Notes
[facility]    Encounter Date: 01/23/2021    Subjective:    Regina Dean is a 65 y.o. female .    Eye Problem (NP, Pt is here for a cataract evaluation OS worse blurry OU glasses not helpful any longer ), Eye Pain (denies any pain ), Spots and/or Floaters (denised an floaters ), and Diabetes (DM2 A1c 6)    HPI   NEW PATIENT CATARACT EVALUATION    This Visit:     I-Design 2.0 Wavescan testing (2nd floor)    Corneal topography Atlas    Corneal topography Galilei    IOL Master with Barrett formula ([id number], CZ70BD, Symphony, Panoptic) XX   OCT (anterior segment, cornea, macula, optic nerve) (Cirrus, [place])        Snellen vision XX   Ocular dominance determination    Manifest refraction XX   Cycloplegic refraction    Brightness acuity test (BAT) XX   Pupillary response for relative afferent pupillary defect (RAPD) XX   Ishihara Pseudoisochromatic plates    IOP using TonoPen as standard or Pneumotonometer in Kpro patients  XX   Pachymetry    Lissamine green or fluorescein stain of ocular surface    Schirmer test with anesthesia    Dilate XX       A-Scan with IOL calculation    B-Scan    UBM - immersion 50 Mhz         Optos wide field photography    Humphrey visual field 24-2    Specular microscopy    Confocal microscopy         Order ProKera amniotic membrane    Order amphotericin b and/or vancomycin (O'Briens)    Kontur contact lens    Betadyne rinse of fornix      Medical History:   Diagnosis Date   ? Basal cell carcinoma of skin, site unspecified    ? Chest pain    ? DM (diabetes mellitus) (HCC)    ? Hyperlipemia      Surgical History:   Procedure Laterality Date   ? LEFT TOTAL HIP ARTHROPLASTY Left 02/21/2020    Performed by Sula Rumple, MD at [facility]   ? RIGHT TOTAL HIP ARTHROPLASTY Right 05/01/2020    Performed by Sula Rumple, MD at [facility]   ? COLONOSCOPY DIAGNOSTIC WITH SPECIMEN COLLECTION BY BRUSHING/ WASHING - FLEXIBLE N/A 07/15/2020    Performed by Samuel Jester, MD at [facility]   ? COLONOSCOPY WITH SNARE REMOVAL TUMOR/ POLYP/ OTHER LESION N/A 07/15/2020    Performed by Samuel Jester, MD at [facility]   ? HX TONSILLECTOMY     ? TUBAL LIGATION       Family History   Problem Relation Age of Onset   ? Unknown to Patient Mother    ? Unknown to Patient Father    ? Amblyopia Neg Hx    ? Autoimmune Disease Neg Hx    ? Blindness Neg Hx    ? Coronary Artery Disease Neg Hx    ? Cancer Neg Hx    ? Cataract Neg Hx    ? Diabetes Neg Hx    ? Glaucoma Neg Hx    ? Hypertension Neg Hx    ? Macular Degen Neg Hx    ? Neurologic Disorder Neg Hx    ? Retinal Detachment Neg Hx    ? Strabismus Neg Hx    ? Stroke Neg Hx    ? Thyroid  Disease Neg Hx      Social History     Socioeconomic History   ? Marital status: Divorced     Spouse name: Not on file   ? Number of children: Not on file   ? Years of education: Not on file   ? Highest education level: Not on file   Occupational History   ? Not on file   Tobacco Use   ? Smoking status: Never Smoker   ? Smokeless tobacco: Never Used   Vaping Use   ? Vaping Use: Never used   Substance and Sexual Activity   ? Alcohol use: No   ? Drug use: No   ? Sexual activity: Not on file   Other Topics Concern   ? Not on file   Social History Narrative    ** Merged History Encounter **              Current Outpatient Medications:   ?  acetaminophen (TYLENOL) 325 mg tablet, Take two tablets by mouth four times daily., Disp: 90 tablet, Rfl: 0  ?  aspirin EC 81 mg tablet, Take one tablet by mouth twice daily. Take with food., Disp: 90 tablet, Rfl: 0  ?  atorvastatin (LIPITOR) 10 mg tablet, Take 10 mg by mouth daily., Disp: , Rfl:   ?  docusate (COLACE) 100 mg capsule, Take one capsule by mouth twice daily as needed for Constipation., Disp: 180 capsule, Rfl: 0  ?  estradioL (ESTRACE) 0.01 % (0.1 mg/g) vaginal cream, Insert or Apply one g to vaginal area twice weekly., Disp: 42.5 g, Rfl: 11  ?  FLAXSEED OIL (OMEGA 3 PO), Take  by mouth daily after lunch., Disp: , Rfl:   ?  JANUVIA 100 mg tab tablet, Take 100 mg by mouth daily after lunch., Disp: , Rfl:   ?  meloxicam (MOBIC) 15 mg tablet, Take 15 mg by mouth daily before breakfast., Disp: , Rfl:   ?  metFORMIN (GLUCOPHAGE) 850 mg tablet, Take 850 mg by mouth twice daily with meals., Disp: , Rfl:   ?  ondansetron (ZOFRAN ODT) 4 mg rapid dissolve tablet, Dissolve one tablet by mouth every 8 hours as needed for Nausea or Vomiting. Place on tongue to dissolve., Disp: 30 tablet, Rfl: 0  ?  oxyCODONE (ROXICODONE) 5 mg tablet, Take one tablet to two tablets by mouth every 4 hours as needed for Pain, Disp: 40 tablet, Rfl: 0  ?  peg-electrolyte solution (NULYTELY) 420 gram oral solution, Mix as directed on package.  Refrigerate once mixed. Do not mix more than 24 hours before procedure., Disp: 4000 mL, Rfl: 0  ?  traMADoL (ULTRAM) 50 mg tablet, Take one tablet to two tablets by mouth every 6 hours as needed., Disp: 30 tablet, Rfl: 0  ?  traMADoL (ULTRAM) 50 mg tablet, Take one tablet to two tablets by mouth every 6 hours as needed for Pain., Disp: 30 tablet, Rfl: 0    has No Known Allergies.    Review of Systems    Objective     Base Eye Exam     Visual Acuity (Snellen - Linear)       Right Left    Dist cc 20/20 20/25     Dist ph cc  NI          Tonometry (Tonopen, 2:57 PM)       Right Left    Pressure 17 17          Pupils  Pupils Dark APD    Right PERRL 3 None    Left PERRL 3 None          Visual Fields       Left Right     Full Full          Extraocular Movement       Right Left     Full Full          Neuro/Psych    Oriented x3: Yes  Mood/Affect: Normal           Dilation     Both eyes: 1.0% Tropicamide, 2.5% Phenylephrine @ 2:59 PM            Additional Tests     Glare Testing       High    Right 20/40    Left 20/50            Refraction     Wearing Rx       Sphere Cylinder Axis Add    Right -1.25 +1.75 108 +2.50    Left -1.25 +1.75 069 +2.50   Type: Bifocal - Lined           Manifest Refraction       Sphere Cylinder Axis Dist VA Add    Right -1.25 +1.75 108 20/20 +2.50    Left -1.25 +1.75 069 20/25 +2.50                    EXAMINATION DIAGNOSIS:  1. Diabetes mellitus due to underlying condition with hyperosmolarity without coma, unspecified whether long term insulin use (HCC)     2. Nuclear age-related cataract, both eyes          RESIDENT/FELLOW COMMENTS:  Assessment:  NA    Plan:  NA     FACULTY COMMENTS:  Assessment:  CATARACTS NON-SURGICAL   NO DIABETIC RETINOPATHY    Plan:  RETURN ONE YEAR    Teaching Statement  I have interviewed and examined the patient and confirm the pertinent findings. I have discussed the case with the resident/fellow and agree with the findings and plan as documented.    (electronically signed)  Carron Brazen, MD  Professor, Clinical Ophthalmology  Cornea and External Diseases  [facility])  Department of Ophthalmology   [facility]  [address]  [place], [place] [address]  [phone] (phone)  [email or web address] (email)

## 2021-01-23 ENCOUNTER — Ambulatory Visit: Admit: 2021-01-23 | Discharge: 2021-01-23 | Payer: Medicaid Other | Primary: Family

## 2021-01-23 ENCOUNTER — Encounter: Admit: 2021-01-23 | Discharge: 2021-01-23 | Primary: Family

## 2021-01-23 ENCOUNTER — Encounter: Admit: 2021-01-23 | Discharge: 2021-01-23 | Payer: 59 | Primary: Family

## 2021-01-23 DIAGNOSIS — E0800 Diabetes mellitus due to underlying condition with hyperosmolarity without nonketotic hyperglycemic-hyperosmolar coma (NKHHC): Secondary | ICD-10-CM

## 2021-01-23 DIAGNOSIS — E785 Hyperlipidemia, unspecified: Secondary | ICD-10-CM

## 2021-01-23 DIAGNOSIS — R079 Chest pain, unspecified: Secondary | ICD-10-CM

## 2021-01-23 DIAGNOSIS — E119 Type 2 diabetes mellitus without complications: Secondary | ICD-10-CM

## 2021-01-23 DIAGNOSIS — H2513 Age-related nuclear cataract, bilateral: Secondary | ICD-10-CM

## 2021-01-23 DIAGNOSIS — C4491 Basal cell carcinoma of skin, unspecified: Secondary | ICD-10-CM

## 2021-02-23 ENCOUNTER — Encounter: Admit: 2021-02-23 | Discharge: 2021-02-23 | Payer: Medicaid Other | Primary: Family

## 2021-02-23 ENCOUNTER — Ambulatory Visit: Admit: 2021-02-23 | Discharge: 2021-02-23 | Payer: Medicaid Other | Primary: Family

## 2021-02-23 ENCOUNTER — Encounter: Admit: 2021-02-23 | Discharge: 2021-02-23 | Payer: No Typology Code available for payment source | Primary: Family

## 2021-02-23 DIAGNOSIS — E785 Hyperlipidemia, unspecified: Secondary | ICD-10-CM

## 2021-02-23 DIAGNOSIS — R079 Chest pain, unspecified: Secondary | ICD-10-CM

## 2021-02-23 DIAGNOSIS — E119 Type 2 diabetes mellitus without complications: Secondary | ICD-10-CM

## 2021-02-23 DIAGNOSIS — C4491 Basal cell carcinoma of skin, unspecified: Secondary | ICD-10-CM

## 2021-02-23 DIAGNOSIS — Z96642 Presence of left artificial hip joint: Secondary | ICD-10-CM

## 2021-02-23 DIAGNOSIS — Z9889 Other specified postprocedural states: Secondary | ICD-10-CM

## 2021-02-23 NOTE — Progress Notes
Orthopaedic Post-Operative Visit - Dora Sims, MD    Date of Visit: 02/23/21  _______________________________________________  DIAGNOSIS:   s/p Left total hip arthroplasty     PROCEDURE:   Left THA (02/21/20)  Right THA 05/01/20     PLAN:  - Return to clinic as needed    ASSESSMENT:   Overall doing well.  Preoperative pain has resolved and the patient has returned to most activities.  The patient is pleased with the outcome up to this point. XRs from today reveal no change in implant alignment from immediate postoperative films. There is no evidence of hardware complication. Nothing concerning at today's visit.. We discussed the fact that failures typically occur within the first year or after 15 years.  No further routine follow-up is required at this time.  Return to clinic as needed for any new symptoms.  ________________________________________________    Interval Hx:  Patient returns for routine postoperative visit.  Ambulation and pain improving over the last couple weeks. Denies fevers, chills, persistent draining from wound, or erythema.      PHYSICAL EXAM:  Vitals:    02/23/21 1012   BP: 122/74   Pulse: 73   Resp: 18   Temp: 36.8 C (98.2 F)   SpO2: 98%       Incision benign, no draining, erythema  No tenderness at surgical site  Motor intact with knee flexion/extension  Distal pulses intact, skin is well perfused

## 2021-03-31 ENCOUNTER — Encounter: Admit: 2021-03-31 | Discharge: 2021-03-31 | Payer: No Typology Code available for payment source | Primary: Family

## 2021-04-01 ENCOUNTER — Encounter: Admit: 2021-04-01 | Discharge: 2021-04-01 | Payer: No Typology Code available for payment source | Primary: Family

## 2021-09-15 ENCOUNTER — Encounter: Admit: 2021-09-15 | Discharge: 2021-09-15 | Payer: No Typology Code available for payment source | Primary: Family

## 2021-09-15 ENCOUNTER — Ambulatory Visit: Admit: 2021-09-15 | Discharge: 2021-09-15 | Payer: Medicaid Other | Primary: Family

## 2021-09-15 DIAGNOSIS — E785 Hyperlipidemia, unspecified: Secondary | ICD-10-CM

## 2021-09-15 DIAGNOSIS — D229 Melanocytic nevi, unspecified: Secondary | ICD-10-CM

## 2021-09-15 DIAGNOSIS — E119 Type 2 diabetes mellitus without complications: Secondary | ICD-10-CM

## 2021-09-15 DIAGNOSIS — D1801 Hemangioma of skin and subcutaneous tissue: Secondary | ICD-10-CM

## 2021-09-15 DIAGNOSIS — Z85828 Personal history of other malignant neoplasm of skin: Secondary | ICD-10-CM

## 2021-09-15 DIAGNOSIS — L821 Other seborrheic keratosis: Secondary | ICD-10-CM

## 2021-09-15 DIAGNOSIS — D485 Neoplasm of uncertain behavior of skin: Secondary | ICD-10-CM

## 2021-09-15 DIAGNOSIS — B079 Viral wart, unspecified: Secondary | ICD-10-CM

## 2021-09-15 DIAGNOSIS — L814 Other melanin hyperpigmentation: Secondary | ICD-10-CM

## 2021-09-15 DIAGNOSIS — C4491 Basal cell carcinoma of skin, unspecified: Secondary | ICD-10-CM

## 2021-09-15 DIAGNOSIS — R079 Chest pain, unspecified: Secondary | ICD-10-CM

## 2021-09-15 DIAGNOSIS — L820 Inflamed seborrheic keratosis: Secondary | ICD-10-CM

## 2021-09-15 DIAGNOSIS — L578 Other skin changes due to chronic exposure to nonionizing radiation: Secondary | ICD-10-CM

## 2021-09-15 NOTE — Patient Instructions
Vitamin D  - We recommend Vitamin D supplementation after a fatty meal, especially if there is no contraindications such as kidney stones    Moles  --Common melanocytic nevi (moles) tend to be =6 mm in diameter and symmetric with even pigmentation, round or oval shape, regular outline, and sharp, non-fuzzy border.  --Dermal nevi stick out from the skin, but if they are soft they are usually not worrisome.  --Flaky seemingly stuck-on brown bumps are usually benign keratoses, not moles.  --Bright red smooth bumps that do not bleed are usually benign blood vessel lesions (cherry angiomas), not moles.  --Atypical nevi/clinical features of possible melanoma include asymmetry, border irregularities, color variability, and diameter >6 mm.  The earliest sign of melanoma is usually a rapidly growing mole.  --About half of melanoma arises in an existing mole and up to half on normal skin.  --It is normal to get new moles until the age of 7-45 years old.  --Multiple atypical nevi are a marker of increased risk of melanoma. The risk of melanoma depends also upon the total number of nevi, family and/or personal history of melanoma, and sun exposure history.   --It is important to look at your moles once a month.  It may help to follow them with photos such as on your smart phone.  Looking once a month you can notice rapid changes and call if these occur.  --Using sunscreens and sun avoidance will decrease your risk of developing melanoma.  The best sun protection is sun avoidance, including with clothing such as long sleeves and a broad brimmed hat.  The best sunscreens are SPF 30 or above cream based with zinc oxide.  One ounce (shot glass sized) amount is needed for an adult.  It should be reapplied every 2 hours if possible.  --Any tanning bed use will increase your risk of melanoma significantly.    Sun Protection   UPF/SPF rated clothing (gloves, long sleeves, scarves); broad-brimmed hats (NOT ball caps!)   [employer] additive can increase the SPF value of your everyday clothing   Cowboy hats protect from sun; baseball hats don't   Here are several recommended zinc-based sunscreen brands in aplphabetical order (always read the ingredient list, as many brands have multiple varieties of sunscreens and not all are zinc-based)   Cyndia Bent, Coca [employer]a, [employer], Culp, [place], [employer]dess [employer], Green Screen,  [employer], Forensic scientist, SkinCeuticals, Vanicream   2 shot-glases = whole body    Melanoma Patient Information    Also called malignant melanoma     Skin cancer screening: If you notice a mole that differs from others or one that changes, bleeds, or itches, see a dermatologist.   Melanoma is a type of skin cancer. Anyone can get melanoma. When found early and treated, the cure rate is nearly 100%. Allowed to grow, melanoma can spread to other parts of the body. Melanoma can spread quickly. When melanoma spreads, it can be deadly.Dermatologists believe that the number of deaths from melanoma would be much lower if people:  Knew the warning signs of melanoma.   Learned how to examine their skin for signs of skin cancer.   Took the time to examine their skin.   It's important to take time to look at the moles on your skin because this is a good way to find melanoma early. When checking your skin, you should look for the ABCDEs of melanoma.     ABCDE's of melanoma:  When performing monthly  skin exams for your moles or new moles, remember the ABCDE's of melanoma:    A - Asymmetry. (Concerning if spot is not symmetric)  B - Border. (Irregular border or notched border are concerning)  C - Color. (Multiple colors or changes in color are concerning.)  D - Diameter. (Larger than 6mm, ie, a pencil eraser, is concerning.)  E - Evolution. (An evolving or changing spot is concerning. If new itch, tenderness, or bleeding develop, these are concerning changes too.  See further explanation below:    Melanoma: Signs and symptoms Anyone can get melanoma. It's important to take time to look at the moles on your skin because this is a good way to find melanoma early. When checking your skin, you should look for the ABCDEs of melanoma.     ABCDEs of melanoma     A = Asymmetry  One half is unlike the other half.       B = Border  An irregular, scalloped, or poorly defined border.       C = Color  Is varied from one area to another; has shades of tan, brown or black, or is sometimes white, red, or blue.       D = Diameter  Melanomas usually greater than 6mm (the size of a pencil eraser) when diagnosed, but they can be smaller.       E = Evolving  A mole or skin lesion that looks different from the rest or is changing in size, shape, or color.    !! If you see a mole or new spot on your skin that has any of the ABCDEs, immediately make an appointment to see a dermatologist.    Signs of melanoma  The most common early signs (what you see) of melanoma are:     Growing mole on your skin.   Unusual looking mole on your skin or a mole that does not look like any other mole on your skin (the ugly duckling).   Non-uniform mole (has an odd shape, uneven or uncertain border, different colors).     Symptoms of melanoma  In the early stages, melanoma may not cause any symptoms (what you feel). But sometimes melanoma will:    Itch.    Bleed.    Feel painful.   Many melanomas have these signs and symptoms, but not all. There are different types of melanoma. One type can first appear as a brown or black streak underneath a fingernail or toenail. Melanoma also can look like a bruise that just won't heal.     Who gets melanoma?  Anyone can get melanoma. Most people who get it have light skin, but people who have brown and black skin also get melanoma.   Some people have a higher risk of getting melanoma. These people have the following traits:   Skin    Fair skin (The risk is higher if the person also has red or blond hair and blue or green eyes). Sun-sensitive skin (rarely tans or burns easily).    50-plus moles, large moles, or unusual-looking moles.    If you have had bad sunburns or spent time tanning (sun, tanning beds, or sun lamps), you also have a higher risk of getting melanoma.   Men older than 50 are at a higher risk for developing skin cancers, including melanoma. Learning how to check your skin and getting skin exams can help detect skin cancer.    Family/medical history   Melanoma runs  in the family (parent, child, sibling, cousin, aunt, uncle had melanoma).   You had another skin cancer, but most especially another melanoma.   A weakened immune system.      Research shows that indoor tanning increases a person's melanoma risk by 75%. The risk also may increase if you had breast or thyroid cancer.    More people getting melanoma  Fewer people are getting most types of cancer. Melanoma is different. More people are getting melanoma. Many are white men who are 50 years or older. More young people also are getting melanoma. Melanoma is now the most common cancer among people 19-66 years old. Even teenagers are getting melanoma.    What causes melanoma?  Ultraviolet (UV) radiation is a major contributor in most cases. We get UV radiation from the sun, tanning beds, and sun lamps. Heredity also plays a role. Research shows that if a close blood relative (parent, child, sibling, aunt, uncle) had melanoma, a person has a much greater risk of getting melanoma.     How do dermatologists diagnose melanoma?  To diagnose melanoma, a dermatologist begins by looking at the patient's skin. A dermatologist will carefully examine moles and other suspicious spots. To get a better look, a dermatologist may use a device called a dermoscope.      Vitamin D    Our bodies need vitamin D to build strong and healthy bones. Vitamin D helps the body absorb the calcium that our bones require.     For a healthy person, the recommended daily dietary allowance is 600 international units for people of 56-6 years old, and 800 international units for people older than 71 years. More vitamin D is not better. Higher amounts of vitamin D could be harmful, leading to many health problems such as high blood pressure and kidney damage.     [employer] Dermatology recommending everyone get vitamin D from foods naturally rich in vitamin D, foods and beverages fortified with vitamin D or vitamin D supplements. The foods that contain the greatest amount are fatty fishes such as salmon, tuna and mackerel. Fish liver oil is another good source.     One of the sources to look up vitamin amount is through [employer]. [employer]. This can help you find out whether you get enough vitamin D from your diet. If you are like many people, you may not be getting your recommended dietary allowance of vitamin D. You may want to change the foods that you eat or take a vitamin D supplements. Before you start taking a vitamin D supplement, talk with your doctor.     Vitamin D is produced in the skin by UV light, but the amount is highly variable and depends on many factors. However, getting vitamin D from the sun or tanning beds can 1) increase your risk of developing skin cancer including melanoma which can be deadly, 2) resulting premature skin aging (wrinkles, age spots, blotchy complexion) and 3) leading to a weakened immune system. Therefore, Teacher, music of Dermatology recommend getting vitamin D safely from foods, beverages and supplements.

## 2021-09-17 ENCOUNTER — Encounter: Admit: 2021-09-17 | Discharge: 2021-09-17 | Payer: No Typology Code available for payment source | Primary: Family

## 2021-09-17 DIAGNOSIS — C44319 Basal cell carcinoma of skin of other parts of face: Secondary | ICD-10-CM

## 2021-10-01 ENCOUNTER — Encounter: Admit: 2021-10-01 | Discharge: 2021-10-01 | Payer: No Typology Code available for payment source | Primary: Family

## 2021-10-01 NOTE — Progress Notes
Contacted patient regarding upcoming MOHS surgery on 10/05/21. Educated patient on procedure and day of surgery instructions. All questions addressed at this time. Patient verbalized understanding. Left call back number for any future questions. Juanna Cao

## 2021-10-05 ENCOUNTER — Encounter: Admit: 2021-10-05 | Discharge: 2021-10-05 | Payer: No Typology Code available for payment source | Primary: Family

## 2021-10-05 ENCOUNTER — Ambulatory Visit: Admit: 2021-10-05 | Discharge: 2021-10-05 | Payer: Medicaid Other | Primary: Family

## 2021-10-05 DIAGNOSIS — E119 Type 2 diabetes mellitus without complications: Secondary | ICD-10-CM

## 2021-10-05 DIAGNOSIS — C4491 Basal cell carcinoma of skin, unspecified: Secondary | ICD-10-CM

## 2021-10-05 DIAGNOSIS — R079 Chest pain, unspecified: Secondary | ICD-10-CM

## 2021-10-05 DIAGNOSIS — E785 Hyperlipidemia, unspecified: Secondary | ICD-10-CM

## 2021-10-05 DIAGNOSIS — C44319 Basal cell carcinoma of skin of other parts of face: Secondary | ICD-10-CM

## 2021-10-05 NOTE — Procedures
Mohs Micrographic Surgery Operative Note    Patient Name: Regina Dean  Date of Birth: 1956-01-09    Procedure: Mohs micrographic surgery  Surgeon and Pathologist: Gloriann Loan, MD    Assistant Surgeon(s): Norval Gable, MD  Referring MD: Sula Soda  Date of Service: 10/05/2021    A detailed discussion of the diagnosis, prognosis, and treatment options including Mohs micrographic surgery, wide local excision, radiation, destruction, and chemotherapy was performed. Based on my medical judgment, Mohs surgery is medically necessary because it is the most appropriate treatment for this cancer compared to other treatments. The rationale for Mohs surgery was explained to the patient and patient opted for Mohs surgery. The risks, benefits, and alternatives to treatment were discussed in detail. Specifically, the risks of pain, infection, swelling, bleeding, bruising, scarring, altered appearance, prolonged wound healing, incomplete removal, allergy to anesthesia, nerve injury, functional impairment, and recurrence discussed. Patient also informed that the skin cancer removal will result in a defect in the skin and soft tissue that may require repair such as a primary closure, flap, graft, or closure in the operating room if needed. If the surgery reveals advanced disease, further resection in the operating room or adjuvant treatment with radiation and/or chemotherapy may be recommended.  All questions answered. Written and verbal informed consent obtained.    Prior to the procedure, a time-out was performed in which the patient's identity was verified and treatment site(s) were clearly identified and cleaned with alcohol prep pads, marked with surgical marker, and confirmed by the patient and surgeons.  All components of time-out protocol completed. Gloriann Loan, MD operated in two distinct and integrated capacities as the surgeon and pathologist.      Lesion A  Mohs Case Number:  Site:  Pre Op Dx:  Post Op Dx:  Tumor Recurrence Status:  PreOp SIze:  PostOp Size:  Number of Stages:  Repair Type(s):  Final Wound Length (cm)/ Area of Flap or Graft (cm2):  Anesthesia: local infiltration  Skin Prep:   [id number]  Left infraorbital cheek  Basal cell carcinoma (nodular)  Basal cell carcinoma (nodular)  Primary  0.9 x 1.4 cm  1.8 x 1.1 cm  1  Primary (complex) repair  4 cm  Lidocaine 1.0% soln w epinephrine    Chlorhexidine        Total Anesthesia per stage and/or repair (mL): 6/  /  /  /  /  /  /  /  /  /     Estimated Blood loss:  Minimal  Complications:  None    Indications for Mohs Micrographic Surgery:    The patient has a 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 The site was prepped with antiseptic, infiltrated with local anesthesia, and draped. The surgical field was again prepped with antiseptic. All visible tumor was completely debulked using a curette and/or scalpel. An excision was made 2-31mm around the debulking defect following standard Mohs technique. Hemostasis was achieved with spot electrocautery. Pressure dressing was applied. Tissue was carefully divided into number specimen(s) indicated below, which was oriented, color coded using ink, and mapped. The specimen was then given to the technician for frozen sectioning, mounting, and staining using the Mohs  protocol. Frozen section analysis showed residual tumor in number of specimens as indicated below.   Number of Specimen(s):  Number of positive specimen(s): 1  0 Microscopic examination of tissue revealed margins clear of tumor.     Repair Note: Complex Repair    Repair Type:  complex repair  Dermal and subcutaneous suture:  4-0 monocryl  Epidermal suture:  5-0 fast absorbing gut  Estimated blood loss:  < 5mL  Complications:  None.  Patient was discharged in stable condition.    Indications:  This patient was left with a skin and soft tissue defect following Mohs surgery.  A variety of closure modalities were discussed with the patient and it was decided that a complex repair would best preserve normal anatomical and functional relationships.  Complex repair was performed for the following indications: extensive undermining was required, the inelastic skin made closure difficult, redundant tissue cones were required to avoid a deformity, to avoid disruption of free anatomic margins, to reduce tension, to reduce risk of skin necrosis, infection, and dehiscence, and to optimize functional and cosmetic results.  After discussing the risks including pain, bleeding, bruising, swelling, infection, scarring, altered appearance, contour deformity, necrosis, wound dehiscence, functional deficit, nerve damage and need for revision, informed consent was obtained and the patient underwent the procedure as follows:    Procedure:  The patient was reclined on the surgery table.  The surgical defect and surrounding skin were cleansed with antiseptic, infiltrated with local anesthesia, re-cleansed with antiseptic, and then prepped with sterile drapes.  Extensive wide undermining was performed with #15 blade and/or iris scissors. The undermined distance was greater than the greatest width of the defect along at least one entire length of the repair.  Extensive undermining to twice the width of the defect was performed to avoid distortion of the margin.   Defect skin edges were de-beveled.  Redundant tissue cones were removed in order to facilitate reconstruction in the patient's natural skin tension lines and to obtain maximum functional and cosmetic results. Hemostasis was achieved with spot electrocautery.  The dermis and subcutaneous tissue were closed with buried vertical mattress sutures.  The epidermis was carefully approximated using simple running sutures.  White petrolatum and pressure dressing applied.  Verbal and written wound care instructions were given.  RTC for suture removal and/or post-op check as directed. The patient was encouraged to follow up with the referring dermatologist.      Post-operative Plan:  - Written and verbal wound care instructions reviewed  - Advised long-term follow-up with referring dermatologist for skin cancer surveillance  - Sun protection reviewed  - Scar management reviewed    - RTC as directed for suture removal and/or wound check

## 2022-02-24 ENCOUNTER — Ambulatory Visit: Admit: 2022-02-24 | Discharge: 2022-02-25 | Payer: Medicaid Other | Primary: Family

## 2022-02-24 ENCOUNTER — Encounter: Admit: 2022-02-24 | Discharge: 2022-02-24 | Payer: No Typology Code available for payment source | Primary: Family

## 2022-02-24 DIAGNOSIS — R079 Chest pain, unspecified: Secondary | ICD-10-CM

## 2022-02-24 DIAGNOSIS — L578 Other skin changes due to chronic exposure to nonionizing radiation: Secondary | ICD-10-CM

## 2022-02-24 DIAGNOSIS — D1801 Hemangioma of skin and subcutaneous tissue: Secondary | ICD-10-CM

## 2022-02-24 DIAGNOSIS — L821 Other seborrheic keratosis: Secondary | ICD-10-CM

## 2022-02-24 DIAGNOSIS — E119 Type 2 diabetes mellitus without complications: Secondary | ICD-10-CM

## 2022-02-24 DIAGNOSIS — Z85828 Personal history of other malignant neoplasm of skin: Secondary | ICD-10-CM

## 2022-02-24 DIAGNOSIS — C4491 Basal cell carcinoma of skin, unspecified: Secondary | ICD-10-CM

## 2022-02-24 DIAGNOSIS — D229 Melanocytic nevi, unspecified: Secondary | ICD-10-CM

## 2022-02-24 DIAGNOSIS — E785 Hyperlipidemia, unspecified: Secondary | ICD-10-CM

## 2022-02-24 DIAGNOSIS — L814 Other melanin hyperpigmentation: Secondary | ICD-10-CM

## 2022-02-24 NOTE — Patient Instructions
Vitamin D  - We recommend Vitamin D supplementation after a fatty meal, especially if there is no contraindications such as kidney stones    Moles  --Common melanocytic nevi (moles) tend to be =6 mm in diameter and symmetric with even pigmentation, round or oval shape, regular outline, and sharp, non-fuzzy border.  --Dermal nevi stick out from the skin, but if they are soft they are usually not worrisome.  --Flaky seemingly stuck-on brown bumps are usually benign keratoses, not moles.  --Bright red smooth bumps that do not bleed are usually benign blood vessel lesions (cherry angiomas), not moles.  --Atypical nevi/clinical features of possible melanoma include asymmetry, border irregularities, color variability, and diameter >6 mm.  The earliest sign of melanoma is usually a rapidly growing mole.  --About half of melanoma arises in an existing mole and up to half on normal skin.  --It is normal to get new moles until the age of 7-45 years old.  --Multiple atypical nevi are a marker of increased risk of melanoma. The risk of melanoma depends also upon the total number of nevi, family and/or personal history of melanoma, and sun exposure history.   --It is important to look at your moles once a month.  It may help to follow them with photos such as on your smart phone.  Looking once a month you can notice rapid changes and call if these occur.  --Using sunscreens and sun avoidance will decrease your risk of developing melanoma.  The best sun protection is sun avoidance, including with clothing such as long sleeves and a broad brimmed hat.  The best sunscreens are SPF 30 or above cream based with zinc oxide.  One ounce (shot glass sized) amount is needed for an adult.  It should be reapplied every 2 hours if possible.  --Any tanning bed use will increase your risk of melanoma significantly.    Sun Protection   UPF/SPF rated clothing (gloves, long sleeves, scarves); broad-brimmed hats (NOT ball caps!)   [employer] additive can increase the SPF value of your everyday clothing   Cowboy hats protect from sun; baseball hats don't   Here are several recommended zinc-based sunscreen brands in aplphabetical order (always read the ingredient list, as many brands have multiple varieties of sunscreens and not all are zinc-based)   Cyndia Bent, Coca [employer]a, [employer], Culp, [place], [employer]dess [employer], Green Screen,  [employer], Forensic scientist, SkinCeuticals, Vanicream   2 shot-glases = whole body    Melanoma Patient Information    Also called malignant melanoma     Skin cancer screening: If you notice a mole that differs from others or one that changes, bleeds, or itches, see a dermatologist.   Melanoma is a type of skin cancer. Anyone can get melanoma. When found early and treated, the cure rate is nearly 100%. Allowed to grow, melanoma can spread to other parts of the body. Melanoma can spread quickly. When melanoma spreads, it can be deadly.Dermatologists believe that the number of deaths from melanoma would be much lower if people:  Knew the warning signs of melanoma.   Learned how to examine their skin for signs of skin cancer.   Took the time to examine their skin.   It's important to take time to look at the moles on your skin because this is a good way to find melanoma early. When checking your skin, you should look for the ABCDEs of melanoma.     ABCDE's of melanoma:  When performing monthly  skin exams for your moles or new moles, remember the ABCDE's of melanoma:    A - Asymmetry. (Concerning if spot is not symmetric)  B - Border. (Irregular border or notched border are concerning)  C - Color. (Multiple colors or changes in color are concerning.)  D - Diameter. (Larger than 6mm, ie, a pencil eraser, is concerning.)  E - Evolution. (An evolving or changing spot is concerning. If new itch, tenderness, or bleeding develop, these are concerning changes too.  See further explanation below:    Melanoma: Signs and symptoms Anyone can get melanoma. It's important to take time to look at the moles on your skin because this is a good way to find melanoma early. When checking your skin, you should look for the ABCDEs of melanoma.     ABCDEs of melanoma     A = Asymmetry  One half is unlike the other half.       B = Border  An irregular, scalloped, or poorly defined border.       C = Color  Is varied from one area to another; has shades of tan, brown or black, or is sometimes white, red, or blue.       D = Diameter  Melanomas usually greater than 6mm (the size of a pencil eraser) when diagnosed, but they can be smaller.       E = Evolving  A mole or skin lesion that looks different from the rest or is changing in size, shape, or color.    !! If you see a mole or new spot on your skin that has any of the ABCDEs, immediately make an appointment to see a dermatologist.    Signs of melanoma  The most common early signs (what you see) of melanoma are:     Growing mole on your skin.   Unusual looking mole on your skin or a mole that does not look like any other mole on your skin (the ugly duckling).   Non-uniform mole (has an odd shape, uneven or uncertain border, different colors).     Symptoms of melanoma  In the early stages, melanoma may not cause any symptoms (what you feel). But sometimes melanoma will:    Itch.    Bleed.    Feel painful.   Many melanomas have these signs and symptoms, but not all. There are different types of melanoma. One type can first appear as a brown or black streak underneath a fingernail or toenail. Melanoma also can look like a bruise that just won't heal.     Who gets melanoma?  Anyone can get melanoma. Most people who get it have light skin, but people who have brown and black skin also get melanoma.   Some people have a higher risk of getting melanoma. These people have the following traits:   Skin    Fair skin (The risk is higher if the person also has red or blond hair and blue or green eyes). Sun-sensitive skin (rarely tans or burns easily).    50-plus moles, large moles, or unusual-looking moles.    If you have had bad sunburns or spent time tanning (sun, tanning beds, or sun lamps), you also have a higher risk of getting melanoma.   Men older than 50 are at a higher risk for developing skin cancers, including melanoma. Learning how to check your skin and getting skin exams can help detect skin cancer.    Family/medical history   Melanoma runs  in the family (parent, child, sibling, cousin, aunt, uncle had melanoma).   You had another skin cancer, but most especially another melanoma.   A weakened immune system.      Research shows that indoor tanning increases a person's melanoma risk by 75%. The risk also may increase if you had breast or thyroid cancer.    More people getting melanoma  Fewer people are getting most types of cancer. Melanoma is different. More people are getting melanoma. Many are white men who are 50 years or older. More young people also are getting melanoma. Melanoma is now the most common cancer among people 19-66 years old. Even teenagers are getting melanoma.    What causes melanoma?  Ultraviolet (UV) radiation is a major contributor in most cases. We get UV radiation from the sun, tanning beds, and sun lamps. Heredity also plays a role. Research shows that if a close blood relative (parent, child, sibling, aunt, uncle) had melanoma, a person has a much greater risk of getting melanoma.     How do dermatologists diagnose melanoma?  To diagnose melanoma, a dermatologist begins by looking at the patient's skin. A dermatologist will carefully examine moles and other suspicious spots. To get a better look, a dermatologist may use a device called a dermoscope.      Vitamin D    Our bodies need vitamin D to build strong and healthy bones. Vitamin D helps the body absorb the calcium that our bones require.     For a healthy person, the recommended daily dietary allowance is 600 international units for people of 56-6 years old, and 800 international units for people older than 71 years. More vitamin D is not better. Higher amounts of vitamin D could be harmful, leading to many health problems such as high blood pressure and kidney damage.     [employer] Dermatology recommending everyone get vitamin D from foods naturally rich in vitamin D, foods and beverages fortified with vitamin D or vitamin D supplements. The foods that contain the greatest amount are fatty fishes such as salmon, tuna and mackerel. Fish liver oil is another good source.     One of the sources to look up vitamin amount is through [employer]. [employer]. This can help you find out whether you get enough vitamin D from your diet. If you are like many people, you may not be getting your recommended dietary allowance of vitamin D. You may want to change the foods that you eat or take a vitamin D supplements. Before you start taking a vitamin D supplement, talk with your doctor.     Vitamin D is produced in the skin by UV light, but the amount is highly variable and depends on many factors. However, getting vitamin D from the sun or tanning beds can 1) increase your risk of developing skin cancer including melanoma which can be deadly, 2) resulting premature skin aging (wrinkles, age spots, blotchy complexion) and 3) leading to a weakened immune system. Therefore, Teacher, music of Dermatology recommend getting vitamin D safely from foods, beverages and supplements.

## 2022-08-18 ENCOUNTER — Encounter: Admit: 2022-08-18 | Discharge: 2022-08-18 | Payer: No Typology Code available for payment source | Primary: Family

## 2022-08-18 ENCOUNTER — Ambulatory Visit: Admit: 2022-08-18 | Discharge: 2022-08-19 | Payer: Medicaid Other | Primary: Family

## 2022-08-18 DIAGNOSIS — E119 Type 2 diabetes mellitus without complications: Secondary | ICD-10-CM

## 2022-08-18 DIAGNOSIS — L821 Other seborrheic keratosis: Secondary | ICD-10-CM

## 2022-08-18 DIAGNOSIS — D1801 Hemangioma of skin and subcutaneous tissue: Secondary | ICD-10-CM

## 2022-08-18 DIAGNOSIS — L820 Inflamed seborrheic keratosis: Secondary | ICD-10-CM

## 2022-08-18 DIAGNOSIS — R079 Chest pain, unspecified: Secondary | ICD-10-CM

## 2022-08-18 DIAGNOSIS — Z85828 Personal history of other malignant neoplasm of skin: Secondary | ICD-10-CM

## 2022-08-18 DIAGNOSIS — E785 Hyperlipidemia, unspecified: Secondary | ICD-10-CM

## 2022-08-18 DIAGNOSIS — D229 Melanocytic nevi, unspecified: Secondary | ICD-10-CM

## 2022-08-18 DIAGNOSIS — C4491 Basal cell carcinoma of skin, unspecified: Secondary | ICD-10-CM

## 2022-08-18 NOTE — Patient Instructions
Vitamin D  - We recommend Vitamin D supplementation after a fatty meal, especially if there is no contraindications such as kidney stones    Moles  --Common melanocytic nevi (moles) tend to be =6 mm in diameter and symmetric with even pigmentation, round or oval shape, regular outline, and sharp, non-fuzzy border.  --Dermal nevi stick out from the skin, but if they are soft they are usually not worrisome.  --Flaky seemingly stuck-on brown bumps are usually benign keratoses, not moles.  --Bright red smooth bumps that do not bleed are usually benign blood vessel lesions (cherry angiomas), not moles.  --Atypical nevi/clinical features of possible melanoma include asymmetry, border irregularities, color variability, and diameter >6 mm.  The earliest sign of melanoma is usually a rapidly growing mole.  --About half of melanoma arises in an existing mole and up to half on normal skin.  --It is normal to get new moles until the age of 7-45 years old.  --Multiple atypical nevi are a marker of increased risk of melanoma. The risk of melanoma depends also upon the total number of nevi, family and/or personal history of melanoma, and sun exposure history.   --It is important to look at your moles once a month.  It may help to follow them with photos such as on your smart phone.  Looking once a month you can notice rapid changes and call if these occur.  --Using sunscreens and sun avoidance will decrease your risk of developing melanoma.  The best sun protection is sun avoidance, including with clothing such as long sleeves and a broad brimmed hat.  The best sunscreens are SPF 30 or above cream based with zinc oxide.  One ounce (shot glass sized) amount is needed for an adult.  It should be reapplied every 2 hours if possible.  --Any tanning bed use will increase your risk of melanoma significantly.    Sun Protection   UPF/SPF rated clothing (gloves, long sleeves, scarves); broad-brimmed hats (NOT ball caps!)   [employer] additive can increase the SPF value of your everyday clothing   Cowboy hats protect from sun; baseball hats don't   Here are several recommended zinc-based sunscreen brands in aplphabetical order (always read the ingredient list, as many brands have multiple varieties of sunscreens and not all are zinc-based)   Cyndia Bent, Coca [employer]a, [employer], Culp, [place], [employer]dess [employer], Green Screen,  [employer], Forensic scientist, SkinCeuticals, Vanicream   2 shot-glases = whole body    Melanoma Patient Information    Also called malignant melanoma     Skin cancer screening: If you notice a mole that differs from others or one that changes, bleeds, or itches, see a dermatologist.   Melanoma is a type of skin cancer. Anyone can get melanoma. When found early and treated, the cure rate is nearly 100%. Allowed to grow, melanoma can spread to other parts of the body. Melanoma can spread quickly. When melanoma spreads, it can be deadly.Dermatologists believe that the number of deaths from melanoma would be much lower if people:  Knew the warning signs of melanoma.   Learned how to examine their skin for signs of skin cancer.   Took the time to examine their skin.   It's important to take time to look at the moles on your skin because this is a good way to find melanoma early. When checking your skin, you should look for the ABCDEs of melanoma.     ABCDE's of melanoma:  When performing monthly  skin exams for your moles or new moles, remember the ABCDE's of melanoma:    A - Asymmetry. (Concerning if spot is not symmetric)  B - Border. (Irregular border or notched border are concerning)  C - Color. (Multiple colors or changes in color are concerning.)  D - Diameter. (Larger than 6mm, ie, a pencil eraser, is concerning.)  E - Evolution. (An evolving or changing spot is concerning. If new itch, tenderness, or bleeding develop, these are concerning changes too.  See further explanation below:    Melanoma: Signs and symptoms Anyone can get melanoma. It's important to take time to look at the moles on your skin because this is a good way to find melanoma early. When checking your skin, you should look for the ABCDEs of melanoma.     ABCDEs of melanoma     A = Asymmetry  One half is unlike the other half.       B = Border  An irregular, scalloped, or poorly defined border.       C = Color  Is varied from one area to another; has shades of tan, brown or black, or is sometimes white, red, or blue.       D = Diameter  Melanomas usually greater than 6mm (the size of a pencil eraser) when diagnosed, but they can be smaller.       E = Evolving  A mole or skin lesion that looks different from the rest or is changing in size, shape, or color.    !! If you see a mole or new spot on your skin that has any of the ABCDEs, immediately make an appointment to see a dermatologist.    Signs of melanoma  The most common early signs (what you see) of melanoma are:     Growing mole on your skin.   Unusual looking mole on your skin or a mole that does not look like any other mole on your skin (the ugly duckling).   Non-uniform mole (has an odd shape, uneven or uncertain border, different colors).     Symptoms of melanoma  In the early stages, melanoma may not cause any symptoms (what you feel). But sometimes melanoma will:    Itch.    Bleed.    Feel painful.   Many melanomas have these signs and symptoms, but not all. There are different types of melanoma. One type can first appear as a brown or black streak underneath a fingernail or toenail. Melanoma also can look like a bruise that just won't heal.     Who gets melanoma?  Anyone can get melanoma. Most people who get it have light skin, but people who have brown and black skin also get melanoma.   Some people have a higher risk of getting melanoma. These people have the following traits:   Skin    Fair skin (The risk is higher if the person also has red or blond hair and blue or green eyes). Sun-sensitive skin (rarely tans or burns easily).    50-plus moles, large moles, or unusual-looking moles.    If you have had bad sunburns or spent time tanning (sun, tanning beds, or sun lamps), you also have a higher risk of getting melanoma.   Men older than 50 are at a higher risk for developing skin cancers, including melanoma. Learning how to check your skin and getting skin exams can help detect skin cancer.    Family/medical history   Melanoma runs  in the family (parent, child, sibling, cousin, aunt, uncle had melanoma).   You had another skin cancer, but most especially another melanoma.   A weakened immune system.      Research shows that indoor tanning increases a person's melanoma risk by 75%. The risk also may increase if you had breast or thyroid cancer.    More people getting melanoma  Fewer people are getting most types of cancer. Melanoma is different. More people are getting melanoma. Many are white men who are 50 years or older. More young people also are getting melanoma. Melanoma is now the most common cancer among people 19-66 years old. Even teenagers are getting melanoma.    What causes melanoma?  Ultraviolet (UV) radiation is a major contributor in most cases. We get UV radiation from the sun, tanning beds, and sun lamps. Heredity also plays a role. Research shows that if a close blood relative (parent, child, sibling, aunt, uncle) had melanoma, a person has a much greater risk of getting melanoma.     How do dermatologists diagnose melanoma?  To diagnose melanoma, a dermatologist begins by looking at the patient's skin. A dermatologist will carefully examine moles and other suspicious spots. To get a better look, a dermatologist may use a device called a dermoscope.      Vitamin D    Our bodies need vitamin D to build strong and healthy bones. Vitamin D helps the body absorb the calcium that our bones require.     For a healthy person, the recommended daily dietary allowance is 600 international units for people of 56-6 years old, and 800 international units for people older than 71 years. More vitamin D is not better. Higher amounts of vitamin D could be harmful, leading to many health problems such as high blood pressure and kidney damage.     [employer] Dermatology recommending everyone get vitamin D from foods naturally rich in vitamin D, foods and beverages fortified with vitamin D or vitamin D supplements. The foods that contain the greatest amount are fatty fishes such as salmon, tuna and mackerel. Fish liver oil is another good source.     One of the sources to look up vitamin amount is through [employer]. [employer]. This can help you find out whether you get enough vitamin D from your diet. If you are like many people, you may not be getting your recommended dietary allowance of vitamin D. You may want to change the foods that you eat or take a vitamin D supplements. Before you start taking a vitamin D supplement, talk with your doctor.     Vitamin D is produced in the skin by UV light, but the amount is highly variable and depends on many factors. However, getting vitamin D from the sun or tanning beds can 1) increase your risk of developing skin cancer including melanoma which can be deadly, 2) resulting premature skin aging (wrinkles, age spots, blotchy complexion) and 3) leading to a weakened immune system. Therefore, Teacher, music of Dermatology recommend getting vitamin D safely from foods, beverages and supplements.

## 2022-09-13 ENCOUNTER — Encounter: Admit: 2022-09-13 | Discharge: 2022-09-13 | Payer: No Typology Code available for payment source | Primary: Family

## 2022-09-13 ENCOUNTER — Ambulatory Visit: Admit: 2022-09-13 | Discharge: 2022-09-14 | Payer: Medicaid Other | Primary: Family

## 2022-09-13 DIAGNOSIS — N952 Postmenopausal atrophic vaginitis: Secondary | ICD-10-CM

## 2022-09-13 DIAGNOSIS — N812 Incomplete uterovaginal prolapse: Secondary | ICD-10-CM

## 2022-09-13 DIAGNOSIS — N3946 Mixed incontinence: Secondary | ICD-10-CM

## 2022-09-13 DIAGNOSIS — R079 Chest pain, unspecified: Secondary | ICD-10-CM

## 2022-09-13 DIAGNOSIS — E119 Type 2 diabetes mellitus without complications: Secondary | ICD-10-CM

## 2022-09-13 DIAGNOSIS — C4491 Basal cell carcinoma of skin, unspecified: Secondary | ICD-10-CM

## 2022-09-13 DIAGNOSIS — N898 Other specified noninflammatory disorders of vagina: Secondary | ICD-10-CM

## 2022-09-13 DIAGNOSIS — E785 Hyperlipidemia, unspecified: Secondary | ICD-10-CM

## 2022-09-13 MED ORDER — CELECOXIB 200 MG PO CAP
200 mg | Freq: Once | ORAL | 0 refills
Start: 2022-09-13 — End: ?

## 2022-09-13 MED ORDER — ESTRADIOL 0.01 % (0.1 MG/GRAM) VA CREA
1 g | VAGINAL | 11 refills | 30.00000 days | Status: AC
Start: 2022-09-13 — End: ?

## 2022-09-13 MED ORDER — METRONIDAZOLE IN NACL (ISO-OS) 500 MG/100 ML IV PGBK
500 mg | Freq: Once | INTRAVENOUS | 0 refills
Start: 2022-09-13 — End: ?

## 2022-09-13 MED ORDER — ACETAMINOPHEN 500 MG PO TAB
1000 mg | Freq: Once | ORAL | 0 refills
Start: 2022-09-13 — End: ?

## 2022-09-13 MED ORDER — PRESURGERY KIT C
PACK | 0 refills | Status: AC
Start: 2022-09-13 — End: ?
  Filled 2022-09-14: qty 1, 1d supply, fill #1

## 2022-09-13 MED ORDER — LACTATED RINGERS IV SOLP
INTRAVENOUS | 0 refills
Start: 2022-09-13 — End: ?

## 2022-09-13 MED ORDER — GABAPENTIN 300 MG PO CAP
600 mg | Freq: Once | ORAL | 0 refills
Start: 2022-09-13 — End: ?

## 2022-09-13 MED ORDER — CEFAZOLIN INJ 1GM IVP
2 g | Freq: Once | INTRAVENOUS | 0 refills
Start: 2022-09-13 — End: ?

## 2022-09-13 NOTE — Patient Instructions
Dr. Georgian Co Post-op Instructions and Recovery    We know that it will take about 12 weeks for the tissues that have been operated on to heal to 80% of their final strength. Then it may take months or ever two years for this healing to be complete. Major risk factors for tearing down the surgical repair in the first weeks or months are constipation and heavy lifting. These recommendations will prevent this from happening.     ACTIVITY  Rest as much as you need to for the first two weeks after your surgery. Plan on several rest periods each day. Some people notice a slight depression after surgery. This will resolve on its own, but please call us if it does not.   Limit exercise to primarily walking. Avoid jogging, aerobics, swimming and biking for at least the first 8 to 12 weeks. However, if you are interested in increasing your activity before this, please ask your doctor or nurse first.   Avoid heavy pushing or pulling, including vacuuming and lifting pets/children/grandchildren. Avoid bending at the waist without bending your knees. You may go up and down stairs, but you should limit your trips to two to three times daily for the first two weeks, and you should always go slowly.   Heavy lifting is a major risk factor for tearing down your repair. You should not lift anything heavier than a gallon of milk (8 to 9 pounds) for the first six weeks. You may gradually increase your lifting to 15 to 20 pounds six to twelve weeks after surgery.    NUTRITION  Resume your normal diet after surgery. Eat a well-balanced diet. Drink six to eight glasses of non-caffeinated beverages daily. Water is especially recommended.   Resume all of your prior medications once you are home unless otherwise instructed by your doctor.    BOWEL MOVEMENTS  Constipation is a major risk factor in tearing down of the surgical repair. We want to avoid constipation. Preventing constipation is much easier than treating it once it happens.   Do not strain during bowel movements.  Narcotics can make you constipated. Although we recognize that, depending on the extent of your surgery you might need to use narcotics, please be aware that they can cause constipation.  You were likely discharged with a prescription for or instructed to pick up over-the-counter MiraLAX (you can use a generic version of polyethylene glycol as well). Please take this daily. If you stool gets too loose while using MiraLAX you can decrease to MiraLAX every other day or every third day.  Titrate those MiraLAX dose according to what your need for a regular bowel movement without straining.   Eat a high fiber diet which includes fruits, vegetables, and bran. Your doctor may have also recommended a fiber supplement like Benefiber, Metamucil, Citrucel, or Fibercon. Take this as directed once you are home from the hospital.  If you start to feel constipated you can take MiraLAX once in the morning and once at night until you have your first bowel movement. Alternatively, you can take a mild laxative such as Milk of Magnesia. In addition, make sure that you are drinking plenty of water, are eating fruits, vegetables, and fiber, and getting enough activity during the day.     VOIDING  Empty your bladder every two to three hours while you are awake, whether you feel like you need to or not. Sometimes surgery can change your sensation of fullness and you may not feel like you  need to void. We want to avoid your bladder becoming too full.  If you have any special bladder instructions, you will be given them in the hospital. If you are unsure about these instructions call the office.  Call the nurse if you begin to experience any bladder problems. These problems may include urgency, frequency or pain with urination.     HYGIENE  You may shower and wash your hair. Make sure someone is with you the first time you shower.   Do not bathe in the bathtub until you have been given permission, usually after you see your surgeon at your 6-week post-op appointment.   Do not douche or use tampons until after your post-op appointment.    FEVER  If you feel feverish or having shaking chills, take your temperature.  If your temperature is 100?F twice in a row, or if your temperature is 101?F or higher one time, call our office. This may mean that you have an infection and we will want to know about that.     ABDOMINAL INCISION CARE  If you have an abdominal incision, clean the incision with water.  Do not use a dressing unless your incision is draining or irritated.  If you have adhesive strips in place and they do not fall off within 10 days, you can carefully peel them off.  Check your incision daily for redness, draining, swelling or separation of the skin. Call the office if you notice any of these symptoms.    PERI CARE  Keep the perineal area (between the vagina and the rectum) clean and dry. Clean after every bowel movement and each time you urinate. Rinsing with plain water or sitz bath can be helpful.    VAGINAL DISCHARGE  You may have vaginal bleeding and discharge for several weeks.  If you have bleeding that is heavier that soaking a maxi pad for two hours in a row call our office. Keep track of when the bleeding began and how many pads you have used.  If you have any foul-smelling vaginal discharge, please call our office.     SEXUAL INTERCOURSE  You should not have sexual intercourse for six to eight weeks after surgery.  You should not put anything in the vagina for six to eight weeks after surgery. The only exceptions are if your doctor told you to use vaginal estrogen cream or pills. If you are using vaginal estrogen cream or pills, you can resume using those when you get home.   Your doctor will tell you when you can resume sexual intercourse at your post-op visit.    PAIN CONTROL  Most women will require pain medication after surgery. If you are comfortable you will be a little more active, which will help recovery.  Follow your doctor's instructions regarding pain medication.  Don't wait too long to take your pain medication.  Because the narcotics can cause constipation, we encourage you to take over the counter medications like Tylenol (generic name: acetaminophen) or ibuprofen when you are able. Ask your nurse about using over the counter medication before your use it.  If your pain is not relieved by medication or gets worse, call the office.    DRIVING         1.   Do not drive within 24 hours of general anesthesia         2.   Do not drive if you are taking narcotic pain medication (oxycodone, hydrocodone, codeine, etc)  3.   You must be able to check your blind spot and quickly move your foot from the gas to the break in order to safely drive    FOLLOW UP APPOINTMENTS  Your follow-up appointments should be scheduled by before you have your surgery. If you do not have an appointment already scheduled, you will have one set up when our nurses call you to check on you once you are home from the hospital.  You will see your doctor at least once by six weeks after your surgery. Often you will also see your doctor about three months after your surgery. Additional appointments can and will be made based upon need.    FOR PATIENTS GOING HOME WITH A CATHETER    PATIENTS PERFORMING INTERMITTENT SELF-CATHETERIZATION: It is important to wash your hands before handling the catheter. Void or (sit on the toilet) every 2 hours and record the volume of urine. After every other void (i.e. every 4 hours) pass the catheter to obtain a post-void residual (PVR) volume and record this. You should also pass the catheter to obtain a PVR after your first morning void and last void before bedtime. It is not necessary to wake yourself up overnight to void, but if you are awakened with the urge to void, you should do so.     PATIENTS WITH A CATHETER WITH A PLUG: It is important to wash your hands before removing/inserting the catheter plug. Once the plug is removed and the catheter has been emptied, clean the end of the catheter and the plug with alcohol. Affix the end of the catheter to your leg to avoid it being pulled out of position. Use the catheter to empty your bladder when you have the natural urge to void. If 3 hours pass and you have not had a natural urge to void, empty your bladder by unplugging the catheter and allowing the catheter to drain the urine out.     Please take the daily dosage of antibiotic prescribed to prevent a bladder infection.     TO REACH A PHYSICIAN:  IF YOU NEED TO REACH DR. Randell Patient DURING BUSINESS HOURS: [phone]  IF YOU NEED TO REACH A PHYSICIAN AFTER HOURS or ON THE WEEKEND CALL THE HOSPITAL: [phone] OR PAGE DR. Randell Patient DIRECTLY: [phone]. IF YOU DO NOT HEAR BACK WITHIN 30 MINUTES, PLEASE CALL [phone].    WHEN TO CALL THE OFFICE ONCE YOU ARE HOME FROM THE HOSPITAL  If you have an urgent problem after hours do not hesitate to call. Routine calls should be placed to our office during regular business hours (8:00 AM to 5:00 PM). You should receive a call back the same day for most calls received by 3:00 PM.     You should call our office and talk with one of the nurses if you notice any of the following:  Fever (your temperature is 100?F twice in a row, or if your temperature is 101?F or higher one time)  Pain that is not relieved by narcotic pain medication  Redness, swelling, discharge or hardness in your incision   Heavy vaginal bleeding (which soaks two or more maxi pads in one hour)  Vaginal discharge which has an unpleasant odor  Shortness of breath - any time  Leg pain or swelling that is unusual for you

## 2022-09-13 NOTE — Progress Notes
CHIEF COMPLAINT:   Chief Complaint   Patient presents with   ? Office Visit Follow Up       History of Present Illness:   Regina Dean is a 66 y.o., female who presents for follow up regarding prolapse.    She has Stage 3 anterior-predominant uterovaginal prolapse, which has been managed with a pessary. She was last seen on 03/17/20 for a pessary check and had a small superficial erosion at the vaginal apex. The pessary was left out and she was to return in 2 weeks but did not follow up until today.     She self-replaced the pessary but has not followed up. She removes the pessary about twice per month. She is not using any vaginal estrogen. She reports occasional bleeding with pessary removal. She denies discharge.    She denies urinary symptoms or difficulty with bowel movements.    She went for a Pap a few months ago and the doctor really struggled. She was told to ask if she could have a surgery. She isn't sure if she wants surgery. Her mother had prolapse and had prolapse with a rapid recurrence of her prolapse, so the patient has been reluctant to proceed.       ALLERGIES:  Patient has no known allergies.    MEDICATIONS:    Current Outpatient Medications:   ?  acetaminophen (TYLENOL) 325 mg tablet, Take two tablets by mouth four times daily., Disp: 90 tablet, Rfl: 0  ?  aspirin EC 81 mg tablet, Take one tablet by mouth twice daily. Take with food., Disp: 90 tablet, Rfl: 0  ?  atorvastatin (LIPITOR) 10 mg tablet, Take one tablet by mouth daily., Disp: , Rfl:   ?  docusate (COLACE) 100 mg capsule, Take one capsule by mouth twice daily as needed for Constipation., Disp: 180 capsule, Rfl: 0  ?  estradioL (ESTRACE) 0.01 % (0.1 mg/g) vaginal cream, Insert or Apply one g to vaginal area twice weekly., Disp: 42.5 g, Rfl: 11  ?  FLAXSEED OIL (OMEGA 3 PO), Take  by mouth daily after lunch., Disp: , Rfl:   ?  JANUVIA 100 mg tab tablet, Take one tablet by mouth daily after lunch., Disp: , Rfl:   ?  meloxicam (MOBIC) 15 mg tablet, Take one tablet by mouth daily before breakfast., Disp: , Rfl:   ?  metFORMIN (GLUCOPHAGE) 850 mg tablet, Take one tablet by mouth twice daily with meals., Disp: , Rfl:   ?  ondansetron (ZOFRAN ODT) 4 mg rapid dissolve tablet, Dissolve one tablet by mouth every 8 hours as needed for Nausea or Vomiting. Place on tongue to dissolve., Disp: 30 tablet, Rfl: 0  ?  oxyCODONE (ROXICODONE) 5 mg tablet, Take one tablet to two tablets by mouth every 4 hours as needed for Pain, Disp: 40 tablet, Rfl: 0  ?  peg-electrolyte solution (NULYTELY) 420 gram oral solution, Mix as directed on package.  Refrigerate once mixed. Do not mix more than 24 hours before procedure., Disp: 4000 mL, Rfl: 0  ?  traMADoL (ULTRAM) 50 mg tablet, Take one tablet to two tablets by mouth every 6 hours as needed., Disp: 30 tablet, Rfl: 0  ?  traMADoL (ULTRAM) 50 mg tablet, Take one tablet to two tablets by mouth every 6 hours as needed for Pain., Disp: 30 tablet, Rfl: 0    Medical History:   Diagnosis Date   ? Basal cell carcinoma of skin, site unspecified    ? Chest pain    ?  DM (diabetes mellitus) (HCC)    ? Hyperlipemia        Surgical History:   Procedure Laterality Date   ? LEFT TOTAL HIP ARTHROPLASTY Left 02/21/2020    Performed by Sula Rumple, [facility] at [facility]   ? RIGHT TOTAL HIP ARTHROPLASTY Right 05/01/2020    Performed by Sula Rumple, MD at [facility]   ? COLONOSCOPY DIAGNOSTIC WITH SPECIMEN COLLECTION BY BRUSHING/ WASHING - FLEXIBLE N/A 07/15/2020    Performed by Samuel Jester, MD at [facility]   ? COLONOSCOPY WITH SNARE REMOVAL TUMOR/ POLYP/ OTHER LESION N/A 07/15/2020    Performed by Samuel Jester, MD at [facility]   ? HX TONSILLECTOMY     ? TUBAL LIGATION         Family History   Problem Relation Age of Onset   ? Unknown to Patient Mother    ? Unknown to Patient Father    ? Amblyopia Neg Hx    ? Autoimmune Disease Neg Hx    ? Blindness Neg Hx    ? Coronary Artery Disease Neg Hx    ? Cancer Neg Hx    ? Cataract Neg Hx ? Diabetes Neg Hx    ? Glaucoma Neg Hx    ? Hypertension Neg Hx    ? Macular Degen Neg Hx    ? Neurologic Disorder Neg Hx    ? Retinal Detachment Neg Hx    ? Strabismus Neg Hx    ? Stroke Neg Hx    ? Thyroid Disease Neg Hx        Social History     Tobacco Use   ? Smoking status: Never   ? Smokeless tobacco: Never   Substance Use Topics   ? Alcohol use: No           OBJECTIVE:    Physical Examination:  General appearance/Vital Signs: BP 116/66 (BP Source: Arm, Right Upper, Patient Position: Sitting)  - Pulse 76  - Temp 36.2 ?C (97.2 ?F) (Temporal)  - Resp 16  - Ht 167.6 cm (5' 6)  - Wt 62.7 kg (138 lb 4.8 oz)  - SpO2 97%  - BMI 22.32 kg/m? , not in acute distress, well groomed  Eyes: Sclera white, no nystagmus  Ears/Nose/Throat: Lips and gums appear normal,adequate hearing ability   Psychiatric / Mental status: oriented to time, place and person; affect and mood appropriate; normal interaction  Cardiovascular / Peripheral vascular system: no prominent varicose veins involving the LE, no cyanosis, no visible edema in LE  Chest / Respiratory: normal respiratory effort, unlabored breathing  Musculoskeletal / Extremities: walking with normal gait, normal joint mobility noted visibly in LE bilat  Gastrointestinal / Lower Abdomen: no direct tenderness, soft, nondistended  Lymphatic: No lymphadenopathy of the groin on both sides  Dermatological: suprapubic and vulvar skin inspected visually without any new lesions noted, suprapubic and vulvar skin palpated with no masses felt  Neurologic: sensation over pubis normal, sensation over labia minora normal   Pelvic:  - Ext. Genitalia - No visible lesions noted involving the labia majora or minora   - Urethra: midline, non-erythematous, no prolapse seen  - Vagina: atrophic appearance, granulation tissue along the posterior vagina from midvagina to vaginal apex, silver nitrate applied. The patient had removed her pessary before the visit  - Cervix: irritation along posterior-lateral cervix, silver nitrate applied  - Uterus: mobile, non-tender  - Adnexa: no masses  - Perineum: normal appearance  ASSESSMENT:  Regina Dean is a 66 y.o. female with Stage 3 anterior-predominant uterovaginal prolapse. She has been using a pessary for conservative management of her prolapse but desires reconstructive surgical management    PLAN:  Prolapse:   - Management options were discussed for Stage 3 uterovaginal prolapse including expectant management, conservative management (a pessary), and surgical management (obliterative vs reconstructive).   - She has been using a pessary (2 1/4 gellhorn) for conservative management. She has been removing and reinserting the pessary independently but has not followed up for routine pessary maintenance. We discussed that a pessary requires maintenance with routine removal and cleaning of the pessary and a speculum exam to ensure that the vaginal tissue remains healthy.   These routine provider visits are typically every 3 months.  This routine maintenance is an important part of using a pessary without complications.   - There are areas of granulation tissue at the vaginal apex, and silver nitrate was applied today. She was instructed to resume regular use of vaginal estrogen cream (Rx sent to her pharmacy) and leave the pessary out. She will follow up in 2 weeks for a tissue check and consideration of pessary reinsertion.   - She desires to proceed with surgical management and will return for urodynamics testing and surgical consultation. She wants surgery before the end of the year.

## 2022-09-14 ENCOUNTER — Encounter: Admit: 2022-09-14 | Discharge: 2022-09-14 | Payer: No Typology Code available for payment source | Primary: Family

## 2022-09-27 ENCOUNTER — Encounter: Admit: 2022-09-27 | Discharge: 2022-09-27 | Payer: No Typology Code available for payment source | Primary: Family

## 2022-09-27 ENCOUNTER — Ambulatory Visit: Admit: 2022-09-27 | Discharge: 2022-09-28 | Payer: Medicaid Other | Primary: Family

## 2022-09-27 DIAGNOSIS — N812 Incomplete uterovaginal prolapse: Secondary | ICD-10-CM

## 2022-09-27 DIAGNOSIS — N898 Other specified noninflammatory disorders of vagina: Secondary | ICD-10-CM

## 2022-09-27 DIAGNOSIS — E785 Hyperlipidemia, unspecified: Secondary | ICD-10-CM

## 2022-09-27 DIAGNOSIS — N3946 Mixed incontinence: Secondary | ICD-10-CM

## 2022-09-27 DIAGNOSIS — E119 Type 2 diabetes mellitus without complications: Secondary | ICD-10-CM

## 2022-09-27 DIAGNOSIS — C4491 Basal cell carcinoma of skin, unspecified: Secondary | ICD-10-CM

## 2022-09-27 DIAGNOSIS — N952 Postmenopausal atrophic vaginitis: Secondary | ICD-10-CM

## 2022-09-27 DIAGNOSIS — R079 Chest pain, unspecified: Secondary | ICD-10-CM

## 2022-09-27 NOTE — Progress Notes
CHIEF COMPLAINT:   Chief Complaint   Patient presents with   ? Pessary Check/fit       History of Present Illness:   Regina Dean is a 66 y.o., female who presents for follow up regarding tissue check.    She was last seen on 09/13/22 and was noted to have a vaginal erosion in the setting of pessary use (2 1/4 gellhorn) - she has been self removing/reinserting the pessary but had not followed up for routine pessary checks. The pessary was left out and she has been using vaginal estrogen twice weekly since then. She denies vaginal bleeding or discharge.     She is ready to proceed with surgical management and is scheduled for urodynamics testing and a preoperative visit next week.          ALLERGIES:  Patient has no known allergies.    MEDICATIONS:    Current Outpatient Medications:   ?  acetaminophen (TYLENOL) 325 mg tablet, Take two tablets by mouth four times daily., Disp: 90 tablet, Rfl: 0  ?  aspirin EC 81 mg tablet, Take one tablet by mouth twice daily. Take with food., Disp: 90 tablet, Rfl: 0  ?  atorvastatin (LIPITOR) 10 mg tablet, Take one tablet by mouth daily., Disp: , Rfl:   ?  docusate (COLACE) 100 mg capsule, Take one capsule by mouth twice daily as needed for Constipation., Disp: 180 capsule, Rfl: 0  ?  estradioL (ESTRACE) 0.01 % (0.1 mg/g) vaginal cream, Insert or Apply one g to vaginal area twice weekly., Disp: 42.5 g, Rfl: 11  ?  FLAXSEED OIL (OMEGA 3 PO), Take  by mouth daily after lunch., Disp: , Rfl:   ?  JANUVIA 100 mg tab tablet, Take one tablet by mouth daily after lunch., Disp: , Rfl:   ?  meloxicam (MOBIC) 15 mg tablet, Take one tablet by mouth daily before breakfast., Disp: , Rfl:   ?  metFORMIN (GLUCOPHAGE) 850 mg tablet, Take one tablet by mouth twice daily with meals., Disp: , Rfl:   ?  ondansetron (ZOFRAN ODT) 4 mg rapid dissolve tablet, Dissolve one tablet by mouth every 8 hours as needed for Nausea or Vomiting. Place on tongue to dissolve., Disp: 30 tablet, Rfl: 0  ? oxyCODONE (ROXICODONE) 5 mg tablet, Take one tablet to two tablets by mouth every 4 hours as needed for Pain, Disp: 40 tablet, Rfl: 0  ?  peg-electrolyte solution (NULYTELY) 420 gram oral solution, Mix as directed on package.  Refrigerate once mixed. Do not mix more than 24 hours before procedure., Disp: 4000 mL, Rfl: 0  ?  traMADoL (ULTRAM) 50 mg tablet, Take one tablet to two tablets by mouth every 6 hours as needed., Disp: 30 tablet, Rfl: 0  ?  traMADoL (ULTRAM) 50 mg tablet, Take one tablet to two tablets by mouth every 6 hours as needed for Pain., Disp: 30 tablet, Rfl: 0    Medical History:   Diagnosis Date   ? Basal cell carcinoma of skin, site unspecified    ? Chest pain    ? DM (diabetes mellitus) (HCC)    ? Hyperlipemia        Surgical History:   Procedure Laterality Date   ? LEFT TOTAL HIP ARTHROPLASTY Left 02/21/2020    Performed by Sula Rumple, MD at [facility]   ? RIGHT TOTAL HIP ARTHROPLASTY Right 05/01/2020    Performed by Sula Rumple, [facility] at [facility]   ? COLONOSCOPY DIAGNOSTIC  WITH SPECIMEN COLLECTION BY BRUSHING/ WASHING - FLEXIBLE N/A 07/15/2020    Performed by Samuel Jester, MD at [facility]   ? COLONOSCOPY WITH SNARE REMOVAL TUMOR/ POLYP/ OTHER LESION N/A 07/15/2020    Performed by Samuel Jester, MD at [facility]   ? HX TONSILLECTOMY     ? TUBAL LIGATION         Family History   Problem Relation Age of Onset   ? Unknown to Patient Mother    ? Unknown to Patient Father    ? Amblyopia Neg Hx    ? Autoimmune Disease Neg Hx    ? Blindness Neg Hx    ? Coronary Artery Disease Neg Hx    ? Cancer Neg Hx    ? Cataract Neg Hx    ? Diabetes Neg Hx    ? Glaucoma Neg Hx    ? Hypertension Neg Hx    ? Macular Degen Neg Hx    ? Neurologic Disorder Neg Hx    ? Retinal Detachment Neg Hx    ? Strabismus Neg Hx    ? Stroke Neg Hx    ? Thyroid Disease Neg Hx        Social History     Tobacco Use   ? Smoking status: Never   ? Smokeless tobacco: Never   Substance Use Topics   ? Alcohol use: No OBJECTIVE:    Physical Examination:  General appearance/Vital Signs: BP 126/67 (BP Source: Arm, Right Upper, Patient Position: Sitting)  - Pulse 84  - Temp 36.7 ?C (98 ?F) (Temporal)  - Resp 16  - Ht 167.6 cm (5' 6)  - Wt 62.1 kg (136 lb 14.4 oz)  - SpO2 99%  - BMI 22.10 kg/m? , not in acute distress, well groomed  Eyes: Sclera white, no nystagmus  Ears/Nose/Throat: Lips and gums appear normal,adequate hearing ability   Psychiatric / Mental status: oriented to time, place and person; affect and mood appropriate; normal interaction  Cardiovascular / Peripheral vascular system: no prominent varicose veins involving the LE, no cyanosis, no visible edema in LE  Chest / Respiratory: normal respiratory effort, unlabored breathing  Musculoskeletal / Extremities: walking with normal gait, normal joint mobility noted visibly in LE bilat  Gastrointestinal / Lower Abdomen: no direct tenderness, soft, nondistended  Lymphatic: No lymphadenopathy of the groin on both sides  Dermatological: suprapubic and vulvar skin inspected visually without any new lesions noted, suprapubic and vulvar skin palpated with no masses felt  Neurologic: sensation over pubis normal, sensation over labia minora normal   Pelvic:  - Ext. Genitalia - No visible lesions noted involving the labia majora or minora   - Urethra: midline, non-erythematous, no prolapse seen  - Vagina: atrophic appearance, granulation tissue at the vaginal apex is hemostatic  - Cervix: no lesions  - Uterus: mobile, non-tender  - Adnexa: no masses  - Perineum: normal appearance      ASSESSMENT:  Regina Dean is a 66 y.o. 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 She was using a pessary and developed vaginal erosions. The pessary has been left out and she is using vaginal estrogen regularly. She is ok with continuing without the pessary.     PLAN:  - Continue expectant management of prolapse until surgery in December. The erosion is healed, but there is a small amount of persistent (hemostatic) granulation tissue.  - Continue vaginal estrogen twice weekly  - She will follow up  for urodynamics testing and surgical consultation next week as scheduled.

## 2022-10-06 ENCOUNTER — Ambulatory Visit: Admit: 2022-10-06 | Discharge: 2022-10-06 | Payer: Medicaid Other | Primary: Family

## 2022-10-06 ENCOUNTER — Ambulatory Visit: Admit: 2022-10-06 | Discharge: 2022-10-06 | Payer: No Typology Code available for payment source | Primary: Family

## 2022-10-06 ENCOUNTER — Encounter: Admit: 2022-10-06 | Discharge: 2022-10-06 | Payer: No Typology Code available for payment source | Primary: Family

## 2022-10-06 DIAGNOSIS — E119 Type 2 diabetes mellitus without complications: Secondary | ICD-10-CM

## 2022-10-06 DIAGNOSIS — R079 Chest pain, unspecified: Secondary | ICD-10-CM

## 2022-10-06 DIAGNOSIS — E785 Hyperlipidemia, unspecified: Secondary | ICD-10-CM

## 2022-10-06 DIAGNOSIS — N3946 Mixed incontinence: Secondary | ICD-10-CM

## 2022-10-06 DIAGNOSIS — C4491 Basal cell carcinoma of skin, unspecified: Secondary | ICD-10-CM

## 2022-10-06 DIAGNOSIS — N812 Incomplete uterovaginal prolapse: Secondary | ICD-10-CM

## 2022-10-06 DIAGNOSIS — N952 Postmenopausal atrophic vaginitis: Secondary | ICD-10-CM

## 2022-10-06 MED ORDER — NITROFURANTOIN MONOHYD/M-CRYST 100 MG PO CAP
100 mg | Freq: Once | ORAL | 0 refills | Status: CP
Start: 2022-10-06 — End: ?
  Administered 2022-10-06: 17:00:00 100 mg via ORAL

## 2022-10-06 NOTE — Patient Instructions
Today you underwent a urodynamics procedure.  It is normal for some patients to feel discomfort with voiding (urinating) for the following 24 hours.  You may see a pink color of urine on the toilet paper with wiping after the first few times you void (urinate).      Urinary tract infections can occur after urodynamics, although very rarely.  We often administer an oral antibiotic to you at the time of the procedure.  If your physician has concerns that you are more at risk of developing a urinary tract infection, you will be prescribed additional antibiotics to take for several more days.      Please call our office if you develop symptoms of a UTI:   Burning with urination lasting more than 24 hours after the procedure   Bladder pressure    New onset of increased frequency of needing to go to the bathroom in the day or night   Increased urgency associated with voiding/urinating   New onset of low back pain that is not in the middle of the back   Chills or low grade fever (temperature above 101.4)    Symptoms of a more severe urinary tract infection that could involve the kidneys are uncommon.  Please go to the nearest emergency room or urgent care if you develop:   Bloody urine   Mental confusion   Mid back pain   Temperature >101.4 degrees   Chills

## 2022-10-06 NOTE — Progress Notes
SURGICAL CONSULTATION:    HPI:  Regina Dean is a 66 y.o., female with Stage 3 anterior-predominant uterovaginal prolapse. She has tried conservative management with a pessary but has struggled with superficial erosions and is ready to proceed with surgical management. She was seen on 09/13/22 and noted to have a vaginal erosion. The pessary was left out with improvement in the erosion noted on 09/27/22. She elected to keep the pessary out since then.     She has T2DM with most recent A1c 6.5% 3 weeks ago.     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 She is planning to bring her card with her that details the blood products she is willing to accept.     Medical History:   Diagnosis Date   ? Basal cell carcinoma of skin, site unspecified    ? Chest pain    ? DM (diabetes mellitus) (HCC)    ? Hyperlipemia      Surgical History:   Procedure Laterality Date   ? LEFT TOTAL HIP ARTHROPLASTY Left 02/21/2020    Performed by Sula Rumple, MD at [facility]   ? RIGHT TOTAL HIP ARTHROPLASTY Right 05/01/2020    Performed by Sula Rumple, MD at [facility]   ? COLONOSCOPY DIAGNOSTIC WITH SPECIMEN COLLECTION BY BRUSHING/ WASHING - FLEXIBLE N/A 07/15/2020    Performed by Samuel Jester, MD at [facility]   ? COLONOSCOPY WITH SNARE REMOVAL TUMOR/ POLYP/ OTHER LESION N/A 07/15/2020    Performed by Samuel Jester, MD at [facility]   ? HX TONSILLECTOMY     ? TUBAL LIGATION       OBJECTIVE:  BP 127/70  HR 80  Weight 139 lb  Height 5' 6    Physical Exam    General appearance: not in acute distress, walking with normal gait  Mental status :oriented to time, place and person; affect and mood appropriate; normal interaction  Cardiovascular: normal heart rate, bilateral LE pulses palpable  Chest / Respiratory: normal respiratory effort, unlabored breathing  Gastrointestinal / Abdomen: no masses, no rebound, no tenderness, soft, nondistended, no hernias,  Laparoscopic (BTL) incisions without tenderness   Extremities: no gross deformities, normal joint mobility   Neruologic: no asymmetric weakness in LE, no abnormalities in sensation in LE  Peripheral vascular system: no cyanosis, no edema in LE, extremities warm  Dermatological: no perineal nevi or rashes noted   Genitourinary:   External genitalia: normal appearance   Urethra: no lesions or prolapse   Vagina: no lesions, no discharge   Cervix: no lesions   Uterus: mobile, non-tender, normal-size   Adnexa: no masses, no tenderness   Perineum normal appearance     Pelvic Organ Prolapse Quantification (POP-Q):  ?  Aa: +2.5 Ba:+2.5 C: -4   GH: 1.5/4.5 PB: 3/3 TVL: 6   Ap: -2.5 Bp:  -2.5 D: -4   ?  Stage 3 anterior-predominant    10/06/22 Urodynamics Impression:  1. Non-instrumented uroflowmetry was unable to be assessed due to inability to void in the office. There was borderline evidence of urinary retention.  2. There was sensory urgency with capacity of 414 mL.  3. Urodynamic stress urinary incontinence was not present.    4. Detrusor overactivity was present and was not associated with incontinence.  5. The voiding pattern was dysfunctional: prolonged and low-amplitude.     ASSESSMENT:  Regina Dean is a 66 y.o. female with Stage 3 anterior-predominant  uterovaginal prolapse. She has been using a pessary for conservative management of her prolapse but desires reconstructive surgical management  ?  PLAN:  Prolapse:   - Management options were reviewed again today for Stage 3 uterovaginal prolapse including expectant management, conservative management (a pessary), and surgical management (obliterative vs reconstructive).   - She has been using a pessary (2 1/4 gellhorn) for conservative management but desires to proceed with surgical management with robot-assisted supracervical hysterectomy, sacrocolpopexy.   - She has T2DM with most recent A1c 6.5%.   - Continue vaginal estrogen  - She is eligible for the REDUCE Trial and verbally consented for participation. She will be contacted by my research coordinator for consent and enrollment.   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 She will be bringing her card with her that details the blood products she is willing to accept.   - We discussed various non-surgical and surgical options, and again agreed to proceed. We discussed surgical risks, which include, but are not limited to, bleeding, infection, injury to nearby organs including bowel, bladder, ureters, urethra, nerves and blood vessels, as well as the possibility of persistent or recurrent symptoms which may require future surgery. We further discussed the risk of postoperative dyspareunia or pelvic pain, and the potential for postoperative voiding dysfunction. Specifically, we discussed the risk of persistent or recurrent postoperative incontinence, or alternately postoperative urinary retention, either of which may require future surgical correction. With respect to permanent polypropylene mesh placement, we reviewed the recent FDA communications and the ~4% risk of a mesh-related complication related to this procedure, which could include mesh erosion into the vagina, bladder or bowel, or alternately pelvic pain or dyspareunia, which may require future surgery. The patient was amenable to these risks, and wishes to proceed.     ______________________________________________________________________________________________________    PLANNED SURGERY:    Laparoscopic Robotic Assisted Supracervical Hysterectomy with Bilateral Salphingo-oophorectomy (Using laparoscopic instrument the uterus would be removed at the level of the cervix, leaving the cervix connected to the top of the vagina)    Laparoscopic Robotic Assisted Sacrocolpopexy (Using laparoscopic instruments a polypropylene mesh would be attached to the top of the vagina and to the sacrum)    Possible Posterior Vaginal Repair (Through a vaginal incision, the tissue between the vagina & rectum is brought together with suture to return the rectum to its anatomical position & strengthen the support )    Cystourethroscopy (look through a scope at the urethra and bladder)    RISKS OF SURGERY:    _____ Anesthesia or medication reaction  _____ DVT/PE (blood clot)  _____ Need for catheter use in the post-op period  _____ Chronic urinary retention  _____ Risk of a urinary tract infection  _____ Bleeding with associated risk of organ failure/injury or death.   _____ Need for additional surgery  _____ New-onset urinary incontinence or worsened urge incontinence  _____ Mesh complication  _____ Injury to surrounding muscles, nerves, blood vessels or organs (bladder, urethra, ureter, bowel)  _____ Fistulae formation  _____ Post-op pain  _____ Pain with intercourse  _____ Recurrent vaginal prolapse   _____ Infection, hematoma, or keloid/scar tissue at incision site  _____ Positioning injury

## 2022-10-06 NOTE — Procedures
UDS Report  Indication:  1. Stage 3 uterovaginal prolapse, desires reconstructive surgical management  2. Urinary hesitancy    Procedure:  The patient was brought to the urodynamics room. Consent was obtained and time out was performed verifying the correct patient and procedure. She was asked to void with evaluation by uroflow. A urine dip confirmed no evidence of infection.     A 7 Fr urodynamic catheter was placed intravesically (Pura, Pves) and in the rectum (Pabd). There was reduction of vaginal prolapse with 2 proctoswabs. EMG stickers were affixed to the bilateral buttock and grounded on the patient's knee. The patient was in the sitting position and was asked to cough to confirm proper placement of the pressure catheters. Cystometrogram was performed with filling at up to 50 mL/min. Incremental sensations of fullness were recorded, and a cough stress test and valsalva stress test were performed as below. The patient was asked to void with the catheters in place for a pressure flow study. At the conclusion of the study all of the catheters, EMG stickers, and prolapse reducing devices were removed.     Complex Uroflowmetry:  Unable to void  UA: negative    Cystometrogram:  Using 28F water filled catheters the bladder was filled at 49ml/min.  Test performed in sitting position at 45 deg.  Prolapse: Yes  Reduction method: procto swabs  First sensation: 20 ml   DO: Yes DO-dry at 219 ml, 352 ml  First urge: 222 ml  Strong urge: 351 ml  MCC: 412 ml  EMG activity:Appropriate activity with filling/cough/valsalva    Stress testing @  150 ml  Cough: No Leak  Valsalva: No Leak  300 ml  Cough: No Leak  Valsalva: No Leak  MCC (414 ml)  Cough: No Leak  Valsalva: No Leak    Pressure-Flow study:  Voided vol: 46 ml + 220 ml into hat at the end of the study  Max flow rate: 13 ml/sec  Det pressure at max flow: unable to void with catheters in place  Flow Pattern: prolonged and low amplitude   Voiding mechanism: Mixed  EMG activity: Abnormal activity during void  Bladder scan PVR: 195 ml    10/06/22 Urodynamics Impression:  1. Non-instrumented uroflowmetry was unable to be assessed due to inability to void in the office. There was borderline evidence of urinary retention.  2. There was sensory urgency with capacity of 414 mL.  3. Urodynamic stress urinary incontinence was not present.    4. Detrusor overactivity was present and was not associated with incontinence.  5. The voiding pattern was dysfunctional: prolonged and low-amplitude.

## 2022-10-20 ENCOUNTER — Encounter: Admit: 2022-10-20 | Discharge: 2022-10-20 | Payer: No Typology Code available for payment source | Primary: Family

## 2022-10-20 NOTE — Research Notes
Patient is a possible candidate for the Reduce study. Study Coordinator will approach prior to Laparoscopic Robotic Assisted Sacrocolpopexy surgery to consent. If patient consents, the study coordinator will document consent and study randomization group.       **Called patient with interpreter [phone]. Patient not available, left a message to return call when possible. Will try to reach patient again asap.

## 2022-11-11 ENCOUNTER — Encounter: Admit: 2022-11-11 | Discharge: 2022-11-11 | Payer: Medicaid Other | Primary: Family

## 2022-11-11 NOTE — Research Notes
Called patient with Interpreter [phone] , regarding the Reduce study. Patient has been sent the consent form survey link in [place]. Message was left to return call. Attempted to call patients daughter as well, and that line is disconnected. Will try again at a later date.

## 2022-11-17 ENCOUNTER — Encounter: Admit: 2022-11-17 | Discharge: 2022-11-17 | Payer: Medicaid Other | Primary: Family

## 2022-11-17 ENCOUNTER — Ambulatory Visit: Admit: 2022-11-17 | Discharge: 2022-11-17 | Payer: Medicaid Other | Primary: Family

## 2022-11-17 DIAGNOSIS — Z01818 Encounter for other preprocedural examination: Secondary | ICD-10-CM

## 2022-11-17 DIAGNOSIS — R079 Chest pain, unspecified: Secondary | ICD-10-CM

## 2022-11-17 DIAGNOSIS — E119 Type 2 diabetes mellitus without complications: Secondary | ICD-10-CM

## 2022-11-17 DIAGNOSIS — C4491 Basal cell carcinoma of skin, unspecified: Secondary | ICD-10-CM

## 2022-11-17 DIAGNOSIS — E785 Hyperlipidemia, unspecified: Secondary | ICD-10-CM

## 2022-11-17 LAB — BASIC METABOLIC PANEL
ANION GAP: 9 pg (ref 3–12)
BLD UREA NITROGEN: 12 mg/dL — ABNORMAL HIGH (ref 7–25)
CALCIUM: 10 mg/dL (ref 8.5–10.6)
CHLORIDE: 101 MMOL/L (ref 98–110)
CO2: 28 MMOL/L (ref 21–30)
CREATININE: 0.5 mg/dL (ref 0.4–1.00)
EGFR: >60 mL/min (ref >60)
GLUCOSE,PANEL: 132 mg/dL — ABNORMAL HIGH (ref 70–100)
POTASSIUM: 4 MMOL/L (ref 3.5–5.1)
SODIUM: 138 MMOL/L (ref 137–147)

## 2022-11-17 LAB — CBC: WBC COUNT: 5.3 K/UL (ref 4.5–11.0)

## 2022-11-17 NOTE — Progress Notes
Due to language barrier, an interpreter was present during the history-taking and subsequent discussion (and for part of the physical exam) with this patient.    Interpreter mode: In person/waiver signed and sent to scanning  Interpreter/ ID Number: Taviana Westergren, pts daughter

## 2022-11-17 NOTE — Unmapped
Blood Preference Conversation    Individuals present for blood preference conversation: advanced practice provider, patient and other: Daughter who is helping interpret, Shelisa    Pertinent details of conversation (including direct quote from patient or surrogate):   "I will accept intraoperative hemodilution, intraoperative cell salvage, normothermia, erythropoietin, volume expanders. I am willing to die if none of these work".    Outcome of conversation, if this were a life/death situation would you accept blood?  Patient states they may consider blood products if clinically necessary but would want to discuss before receiving    [facility] Procedures - Quick Lists for: Blood and blood product transfusion ([email or web address])

## 2022-11-24 ENCOUNTER — Encounter: Admit: 2022-11-24 | Discharge: 2022-11-24 | Payer: Medicaid Other | Primary: Family

## 2022-11-24 NOTE — Research Notes
Called patient to discuss Reduce study information, called patient via language line, interpreter [phone] Roxanna. Patient will consent to study, advised that consent form has been sent via [place] for her to complete.

## 2022-11-25 ENCOUNTER — Encounter: Admit: 2022-11-25 | Discharge: 2022-11-25 | Payer: Medicaid Other | Primary: Family

## 2022-11-25 NOTE — Research Notes
Study Title: Reduce    HSC/IRB Number: [id number]    Consent Version Date: 16 Jul 2020    Randomization Arm:     CONSENT FORM SIGNED ELECTRONICALLY IN [facility]      The consent process was discussed with the participant remotely. The consent was obtained via [place], on 10/28/22. The signed consent was received via [place] on 11/24/22.    Clinical trial participation and research nature of the trial were discussed with the participant during this visit. Participant was alert and oriented during consent discussion. Participant was informed that clinical trial is voluntary and she may withdraw consent at any time for any reason by notifying study team. Study purpose, procedures, tests, samples to be obtained, potential side effects, benefits, foreseeable risks, and duration of study were discussed. HIPAA information, compensation and insurance pre-certification were discussed per consent form. Participant verbalized understanding.    Participant was given time to review the consent form and to discuss participation in this study. Questions asked were answered to participant satisfaction and participant voiced desire to sign consent today. Participant signed consent without coercion and undue influence A copy of the signed consent was sent to the participant electronically via email and will be stored on the [employer] secure servers. Contact information for the study team was given to the participant.     No research specific procedures took place prior to consenting.

## 2022-11-26 ENCOUNTER — Encounter: Admit: 2022-11-26 | Discharge: 2022-11-26 | Payer: Medicaid Other | Primary: Family

## 2022-11-26 ENCOUNTER — Ambulatory Visit: Admit: 2022-11-26 | Discharge: 2022-11-26 | Payer: Medicaid Other | Primary: Family

## 2022-11-26 DIAGNOSIS — E785 Hyperlipidemia, unspecified: Secondary | ICD-10-CM

## 2022-11-26 DIAGNOSIS — M180 Bilateral primary osteoarthritis of first carpometacarpal joints: Secondary | ICD-10-CM

## 2022-11-26 DIAGNOSIS — R079 Chest pain, unspecified: Secondary | ICD-10-CM

## 2022-11-26 DIAGNOSIS — M79641 Pain in right hand: Secondary | ICD-10-CM

## 2022-11-26 DIAGNOSIS — E119 Type 2 diabetes mellitus without complications: Secondary | ICD-10-CM

## 2022-11-26 DIAGNOSIS — C4491 Basal cell carcinoma of skin, unspecified: Secondary | ICD-10-CM

## 2022-11-26 DIAGNOSIS — M25521 Pain in right elbow: Secondary | ICD-10-CM

## 2022-11-26 MED ORDER — NAPROXEN 500 MG PO TAB
500 mg | ORAL_TABLET | Freq: Two times a day (BID) | ORAL | 1 refills | Status: AC
Start: 2022-11-26 — End: ?

## 2022-11-26 MED ORDER — LIDOCAINE (PF) 10 MG/ML (1 %) IJ SOLN
1 mL | Freq: Once | INTRAMUSCULAR | 0 refills | Status: CP | PRN
Start: 2022-11-26 — End: ?

## 2022-11-26 MED ORDER — TRIAMCINOLONE ACETONIDE 40 MG/ML IJ SUSP
40 mg | Freq: Once | INTRAMUSCULAR | 0 refills | Status: CP | PRN
Start: 2022-11-26 — End: ?

## 2022-11-26 NOTE — Progress Notes
[facility] Hand Surgery      Date of Service: 11/26/2022    Subjective:            Bilateral thumb pain    History of Present Illness  This is a 66 year old Spanish-speaking female accompanied by her daughter who translates for her.  We offered a video interpreter for this visit and it was declined.  The patient states she has had several years of pain around the base of both of her thumbs.  There was no injury.  She reports aching pain which is worse with pinching and grasping.  It bothers her at work.  She has been treating with her primary care doctor who has given her some steroid injections and she is also taken anti-inflammatory medicines but the pain continues.  She denies any sensory disturbances.  Fany Eigsti is a 66 y.o. female.     Review of Systems      Objective:         ? atorvastatin (LIPITOR) 40 mg tablet Take one tablet by mouth at bedtime daily.   ? JANUVIA 100 mg tab tablet Take one tablet by mouth daily after lunch.   ? metFORMIN (GLUCOPHAGE) 850 mg tablet Take one tablet by mouth twice daily with meals.     Vitals:    11/26/22 1237   PainSc: Four     There is no height or weight on file to calculate BMI.     Physical Exam  Constitutional:       General: She is not in acute distress.     Appearance: Normal appearance.   Neurological:      Mental Status: She is oriented to person, place, and time.   Psychiatric:         Behavior: Behavior normal.       Ortho Exam  Bilateral hands: The skin is intact, there are no wounds or scars.  There is some swelling and enlargement the area of the bilateral thumb CMC joints with mild tenderness and a positive CMC grind test.  She has otherwise full range of motion of the wrist and digits.  There is intact sensation and brisk cap refill to all digits.       Assessment and Plan:  She has symptomatic bilateral thumb CMC joint arthritis.  I would recommend trying some further conservative care such as steroid injections, oral anti-inflammatories, and bracing.  We also spoke about surgical treatment options should these measures fail.  We described to her the thumb Avala arthroplasty procedure under local anesthesia.  She would like to avoid surgery for now and we will initiate conservative care.  She is welcome to follow-up in a month if she is not getting better.    Small Joint Injection/Aspiration: R thumb CMC on 11/26/2022 12:30 PM    Consent:   Consent obtained: verbal  Consent given by: patient  Risks discussed: skin discoloration, bleeding, damage to surrounding structures, hyperglycemia, infection, pain, soft tissue reaction, subcutaneous fat atrophy, tendon rupture, vasovagal reaction and arrhythmia  Alternatives discussed: alternative treatment, delayed treatment and no treatment  Discussed with patient the purpose of the treatment/procedure, other ways of treating my condition, including no treatment/ procedure and the risks and benefits of the alternatives. Patient has decided to proceed with treatment/procedure.        Universal Protocol:  Relevant documents: relevant documents present and verified  Patient identity confirmed: Patient identify confirmed verbally with patient.  Procedures Details:  Procedure Peformed: Injection Only  Indications: pain  Details:Prep: alcohol   25 G needle, dorsal approachMedications: 40 mg triamcinolone acetonide 40 mg/mL; 1 mL lidocaine PF 1% (10 mg/mL)  Outcome: tolerated well, no immediate complications                             Philomena Course, MD  Associate Professor  Orthopedic Hand Surgery

## 2022-11-26 NOTE — Patient Instructions
It was a pleasure seeing you today. Please contact us if you have any further questions, we are happy to assist.     For nursing/medical/casting or splinting questions, please send a message through MyChart. This is our preferred method of contact and the most efficient way to contact your healthcare team. If you do not have internet access/smartphone you can call my Clinical Nurse Coordinator at [phone]. Please do not leave multiple voicemail's for us. Leaving multiple voicemail's delays us getting back to you quicker.    NOTE: MyChart messages and phone calls received on weekends, on holidays, and after 4 pm on weekdays will NOT be seen until the following business day.     - For medication refills, please ask your pharmacy to send an electronic request.  Please allow at least 3 business days for medication refills.    - To cancel, change, or schedule a clinic appointment: Call  Scheduling at [phone].  - For any radiology concerns or scheduling, please call [phone].     Any insurance, disability or Family Medical Leave Act (FMLA) forms can be faxed to [phone].  Be sure to include your name and date of birth on the form. We will complete and fax the forms where they need to go as soon as we are able. For any questions about disability or FMLA paperwork, please contact Dr. Drake's administrative assistant, Adrianna at [phone].    Do not hesitate to contact our office with any further questions.Thank you and have a great day!    Matthew Drake, MD   [facility]   Orthopedic & Sports Medicine  Office: [phone]  Fax: [phone]

## 2022-11-26 NOTE — Progress Notes
Small Joint Injection/Aspiration: L thumb CMC on 11/26/2022 12:30 PM    Consent:   Consent obtained: verbal  Consent given by: patient  Risks discussed: skin discoloration, bleeding, damage to surrounding structures, hyperglycemia, infection, pain, soft tissue reaction, subcutaneous fat atrophy, tendon rupture, vasovagal reaction and arrhythmia  Alternatives discussed: alternative treatment, delayed treatment and no treatment  Discussed with patient the purpose of the treatment/procedure, other ways of treating my condition, including no treatment/ procedure and the risks and benefits of the alternatives. Patient has decided to proceed with treatment/procedure.        Universal Protocol:  Relevant documents: relevant documents present and verified  Patient identity confirmed: Patient identify confirmed verbally with patient.          Procedures Details:  Procedure Peformed: Injection Only  Indications: pain  Details:Prep: alcohol   25 G needle, dorsal approachMedications: 40 mg triamcinolone acetonide 40 mg/mL; 1 mL lidocaine PF 1% (10 mg/mL)  Outcome: tolerated well, no immediate complications

## 2022-12-01 ENCOUNTER — Encounter: Admit: 2022-12-01 | Discharge: 2022-12-01 | Payer: Medicaid Other | Primary: Family

## 2022-12-01 DIAGNOSIS — E119 Type 2 diabetes mellitus without complications: Secondary | ICD-10-CM

## 2022-12-01 DIAGNOSIS — R079 Chest pain, unspecified: Secondary | ICD-10-CM

## 2022-12-01 DIAGNOSIS — C4491 Basal cell carcinoma of skin, unspecified: Secondary | ICD-10-CM

## 2022-12-01 DIAGNOSIS — E785 Hyperlipidemia, unspecified: Secondary | ICD-10-CM

## 2022-12-02 ENCOUNTER — Encounter: Admit: 2022-12-02 | Discharge: 2022-12-02 | Payer: Medicaid Other | Primary: Family

## 2022-12-02 DIAGNOSIS — E119 Type 2 diabetes mellitus without complications: Secondary | ICD-10-CM

## 2022-12-02 DIAGNOSIS — C4491 Basal cell carcinoma of skin, unspecified: Secondary | ICD-10-CM

## 2022-12-02 DIAGNOSIS — R079 Chest pain, unspecified: Secondary | ICD-10-CM

## 2022-12-02 DIAGNOSIS — E785 Hyperlipidemia, unspecified: Secondary | ICD-10-CM

## 2022-12-02 MED ORDER — DEXAMETHASONE SODIUM PHOSPHATE 4 MG/ML IJ SOLN
INTRAVENOUS | 0 refills | Status: DC
Start: 2022-12-02 — End: 2022-12-02

## 2022-12-02 MED ORDER — ARTIFICIAL TEARS (PF) SINGLE DOSE DROPS GROUP
OPHTHALMIC | 0 refills | Status: DC
Start: 2022-12-02 — End: 2022-12-02

## 2022-12-02 MED ORDER — HYDROMORPHONE (PF) 2 MG/ML IJ SYRG
INTRAVENOUS | 0 refills | Status: DC
Start: 2022-12-02 — End: 2022-12-02

## 2022-12-02 MED ORDER — ROCURONIUM 10 MG/ML IV SOLN
INTRAVENOUS | 0 refills | Status: DC
Start: 2022-12-02 — End: 2022-12-02

## 2022-12-02 MED ORDER — LIDOCAINE (PF) 200 MG/10 ML (2 %) IJ SYRG
INTRAVENOUS | 0 refills | Status: DC
Start: 2022-12-02 — End: 2022-12-02

## 2022-12-02 MED ORDER — PROPOFOL INJ 10 MG/ML IV VIAL
INTRAVENOUS | 0 refills | Status: DC
Start: 2022-12-02 — End: 2022-12-02

## 2022-12-02 MED ORDER — FENTANYL CITRATE (PF) 50 MCG/ML IJ SOLN
INTRAVENOUS | 0 refills | Status: DC
Start: 2022-12-02 — End: 2022-12-02

## 2022-12-02 MED ORDER — PROPOFOL 10 MG/ML IV EMUL 100 ML (INFUSION)(AM)(OR)
INTRAVENOUS | 0 refills | Status: DC
Start: 2022-12-02 — End: 2022-12-02
  Administered 2022-12-02: 14:00:00 30 ug/kg/min via INTRAVENOUS

## 2022-12-02 MED ORDER — SUGAMMADEX 100 MG/ML IV SOLN
INTRAVENOUS | 0 refills | Status: DC
Start: 2022-12-02 — End: 2022-12-02

## 2022-12-02 MED ORDER — MIDAZOLAM 1 MG/ML IJ SOLN
INTRAVENOUS | 0 refills | Status: DC
Start: 2022-12-02 — End: 2022-12-02

## 2022-12-02 MED ORDER — ONDANSETRON HCL (PF) 4 MG/2 ML IJ SOLN
INTRAVENOUS | 0 refills | Status: DC
Start: 2022-12-02 — End: 2022-12-02

## 2022-12-02 MED ADMIN — FENTANYL CITRATE (PF) 50 MCG/ML IJ SOLN [3037]: 25 ug | INTRAVENOUS | @ 20:00:00 | Stop: 2022-12-02 | NDC 00409909412

## 2022-12-02 MED ADMIN — ACETAMINOPHEN 325 MG PO TAB [101]: 650 mg | ORAL | @ 23:00:00 | NDC 00904677361

## 2022-12-02 MED ADMIN — LACTATED RINGERS IV SOLP [4318]: 1000.000 mL | INTRAVENOUS | @ 14:00:00 | Stop: 2022-12-02 | NDC 00338011704

## 2022-12-02 MED ADMIN — OXYCODONE 5 MG PO TAB [10814]: 5 mg | ORAL | @ 20:00:00 | Stop: 2022-12-02 | NDC 00406055223

## 2022-12-02 MED ADMIN — LACTATED RINGERS IV SOLP [4318]: 1000.000 mL | INTRAVENOUS | @ 16:00:00 | Stop: 2022-12-02 | NDC 00338011704

## 2022-12-02 MED ADMIN — METRONIDAZOLE IN NACL (ISO-OS) 500 MG/100 ML IV PGBK [5018]: 500 mg | INTRAVENOUS | @ 14:00:00 | Stop: 2022-12-02 | NDC 00409015201

## 2022-12-02 MED ADMIN — CEFAZOLIN INJ 1GM IVP [210319]: 2 g | INTRAVENOUS | @ 18:00:00 | Stop: 2022-12-02 | NDC 00143992490

## 2022-12-02 MED ADMIN — SODIUM CHLORIDE 0.9 % IV SOLP [27838]: 1000.000 mL | INTRAVENOUS | @ 20:00:00 | Stop: 2022-12-03 | NDC 00338004904

## 2022-12-02 MED ADMIN — CEFAZOLIN INJ 1GM IVP [210319]: 2 g | INTRAVENOUS | @ 14:00:00 | Stop: 2022-12-02 | NDC 00143992490

## 2022-12-02 MED ADMIN — SCOPOLAMINE BASE 1 MG OVER 3 DAYS TD PT3D [82141]: 1 | TRANSDERMAL | @ 14:00:00 | Stop: 2022-12-02 | NDC 10019055390

## 2022-12-02 MED ADMIN — FENTANYL CITRATE (PF) 50 MCG/ML IJ SOLN [3037]: 25 ug | INTRAVENOUS | @ 19:00:00 | Stop: 2022-12-02 | NDC 00409909412

## 2022-12-02 MED ADMIN — ACETAMINOPHEN 500 MG PO TAB [102]: 1000 mg | ORAL | @ 13:00:00 | Stop: 2022-12-02 | NDC 00904672080

## 2022-12-02 MED ADMIN — IBUPROFEN 600 MG PO TAB [3844]: 600 mg | ORAL | @ 23:00:00 | NDC 00904585461

## 2022-12-02 MED ADMIN — CELECOXIB 200 MG PO CAP [76958]: 200 mg | ORAL | @ 13:00:00 | Stop: 2022-12-02 | NDC 00904650361

## 2022-12-03 ENCOUNTER — Encounter: Admit: 2022-12-03 | Discharge: 2022-12-03 | Payer: Medicaid Other | Primary: Family

## 2022-12-03 MED ADMIN — INSULIN ASPART 100 UNIT/ML SC FLEXPEN [87504]: 1 [IU] | SUBCUTANEOUS | @ 04:00:00 | NDC 00169633910

## 2022-12-03 MED ADMIN — SENNOSIDES-DOCUSATE SODIUM 8.6-50 MG PO TAB [40926]: 1 | ORAL | @ 03:00:00 | NDC 00536124810

## 2022-12-03 MED ADMIN — IBUPROFEN 600 MG PO TAB [3844]: 600 mg | ORAL | @ 08:00:00 | Stop: 2022-12-03 | NDC 00904585461

## 2022-12-03 MED ADMIN — SENNOSIDES-DOCUSATE SODIUM 8.6-50 MG PO TAB [40926]: 1 | ORAL | @ 16:00:00 | Stop: 2022-12-03 | NDC 00536124810

## 2022-12-03 MED ADMIN — ACETAMINOPHEN 325 MG PO TAB [101]: 650 mg | ORAL | @ 16:00:00 | Stop: 2022-12-03 | NDC 00904677361

## 2022-12-03 MED ADMIN — OXYCODONE 5 MG PO TAB [10814]: 5 mg | ORAL | @ 08:00:00 | Stop: 2022-12-03 | NDC 00406055223

## 2022-12-03 MED ADMIN — ACETAMINOPHEN 325 MG PO TAB [101]: 650 mg | ORAL | @ 04:00:00 | NDC 00904677361

## 2022-12-03 MED ADMIN — IBUPROFEN 600 MG PO TAB [3844]: 600 mg | ORAL | @ 16:00:00 | Stop: 2022-12-03 | NDC 00904585461

## 2022-12-03 MED ADMIN — ACETAMINOPHEN 325 MG PO TAB [101]: 650 mg | ORAL | @ 10:00:00 | Stop: 2022-12-03 | NDC 00904677361

## 2022-12-03 MED FILL — SENNOSIDES-DOCUSATE SODIUM 8.6-50 MG PO TAB: ORAL | 23 days supply | Qty: 45 | Fill #1 | Status: CP

## 2022-12-03 MED FILL — POLYETHYLENE GLYCOL 3350 17 GRAM PO PWPK: 17 g | ORAL | 12 days supply | Qty: 12 | Fill #1 | Status: CP

## 2022-12-03 MED FILL — IBUPROFEN 600 MG PO TAB: 600 mg | ORAL | 8 days supply | Qty: 30 | Fill #1 | Status: CP

## 2022-12-03 MED FILL — ACETAMINOPHEN 325 MG PO TAB: 325 mg | ORAL | 4 days supply | Qty: 30 | Fill #1 | Status: CP

## 2022-12-03 MED FILL — OXYCODONE 5 MG PO TAB: 5 mg | ORAL | 2 days supply | Qty: 12 | Fill #1 | Status: CP

## 2022-12-17 ENCOUNTER — Encounter: Admit: 2022-12-17 | Discharge: 2022-12-17 | Payer: Medicaid Other | Primary: Family

## 2022-12-17 DIAGNOSIS — E119 Type 2 diabetes mellitus without complications: Secondary | ICD-10-CM

## 2022-12-17 DIAGNOSIS — R079 Chest pain, unspecified: Secondary | ICD-10-CM

## 2022-12-17 DIAGNOSIS — E785 Hyperlipidemia, unspecified: Secondary | ICD-10-CM

## 2022-12-17 DIAGNOSIS — C4491 Basal cell carcinoma of skin, unspecified: Secondary | ICD-10-CM

## 2023-02-23 ENCOUNTER — Ambulatory Visit: Admit: 2023-02-23 | Discharge: 2023-02-24 | Payer: Medicaid Other | Primary: Family

## 2023-02-23 ENCOUNTER — Encounter: Admit: 2023-02-23 | Discharge: 2023-02-23 | Payer: Medicaid Other | Primary: Family

## 2023-02-23 DIAGNOSIS — L814 Other melanin hyperpigmentation: Secondary | ICD-10-CM

## 2023-02-23 DIAGNOSIS — L821 Other seborrheic keratosis: Secondary | ICD-10-CM

## 2023-02-23 DIAGNOSIS — L570 Actinic keratosis: Secondary | ICD-10-CM

## 2023-02-23 DIAGNOSIS — D229 Melanocytic nevi, unspecified: Secondary | ICD-10-CM

## 2023-02-23 DIAGNOSIS — Z85828 Personal history of other malignant neoplasm of skin: Secondary | ICD-10-CM

## 2023-02-23 DIAGNOSIS — D1801 Hemangioma of skin and subcutaneous tissue: Secondary | ICD-10-CM

## 2023-02-23 NOTE — Patient Instructions
Moles  --Common melanocytic nevi (moles) tend to be =6 mm in diameter and symmetric with even pigmentation, round or oval shape, regular outline, and sharp, non-fuzzy border.  --Dermal nevi stick out from the skin, but if they are soft they are usually not worrisome.  --Flaky seemingly stuck-on brown bumps are usually benign keratoses, not moles.  --Bright red smooth bumps that do not bleed are usually benign blood vessel lesions (cherry angiomas), not moles.  --Atypical nevi/clinical features of possible melanoma include asymmetry, border irregularities, color variability, and diameter >6 mm.  The earliest sign of melanoma is usually a rapidly growing mole.  --About half of melanoma arises in an existing mole and up to half on normal skin.  --It is normal to get new moles until the age of 30-40 years old.  --Multiple atypical nevi are a marker of increased risk of melanoma. The risk of melanoma depends also upon the total number of nevi, family and/or personal history of melanoma, and sun exposure history.   --It is important to look at your moles once a month.  It may help to follow them with photos such as on your smart phone.  Looking once a month you can notice rapid changes and call if these occur.  --Using sunscreens and sun avoidance will decrease your risk of developing melanoma.  The best sun protection is sun avoidance, including with clothing such as long sleeves and a broad brimmed hat.  The best sunscreens are SPF 30 or above cream based with zinc oxide.  One ounce (shot glass sized) amount is needed for an adult.  It should be reapplied every 2 hours if possible.  --Any tanning bed use will increase your risk of melanoma significantly.    Sun Protection  UPF/SPF rated clothing (gloves, long sleeves, scarves); broad-brimmed hats (NOT ball caps!)  RIT Sunguard laundry additive can increase the SPF value of your everyday clothing  Cowboy hats protect from sun; baseball hats don't  Here are several recommended zinc-based sunscreen brands in aplphabetical order (always read the ingredient list, as many brands have multiple varieties of sunscreens and not all are zinc-based)  Badger, Bare Minerals, Blue Lizard, CeraVe, Cotz, Goddess Garden, Green Screen,  [employer], Mineral Fusion, SkinCeuticals, Vanicream  2 shot-glases = whole body    Melanoma Patient Information    Also called malignant melanoma     Skin cancer screening: If you notice a mole that differs from others or one that changes, bleeds, or itches, see a dermatologist.   Melanoma is a type of skin cancer. Anyone can get melanoma. When found early and treated, the cure rate is nearly 100%. Allowed to grow, melanoma can spread to other parts of the body. Melanoma can spread quickly. When melanoma spreads, it can be deadly.Dermatologists believe that the number of deaths from melanoma would be much lower if people:  Knew the warning signs of melanoma.   Learned how to examine their skin for signs of skin cancer.   Took the time to examine their skin.   It?s important to take time to look at the moles on your skin because this is a good way to find melanoma early. When checking your skin, you should look for the ABCDEs of melanoma.     ABCDE's of melanoma:  When performing monthly skin exams for your moles or new moles, remember the ABCDE's of melanoma:    A - Asymmetry. (Concerning if spot is not symmetric)  B - Border. (Irregular border or   notched border are concerning)  C - Color. (Multiple colors or changes in color are concerning.)  D - Diameter. (Larger than 6mm, ie, a pencil eraser, is concerning.)  E - Evolution. (An evolving or changing spot is concerning. If new itch, tenderness, or bleeding develop, these are concerning changes too.  See further explanation below:    Melanoma: Signs and symptoms     Anyone can get melanoma. It?s important to take time to look at the moles on your skin because this is a good way to find melanoma early. When checking your skin, you should look for the ABCDEs of melanoma.     ABCDEs of melanoma     A = Asymmetry  One half is unlike the other half.       B = Border  An irregular, scalloped, or poorly defined border.       C = Color  Is varied from one area to another; has shades of tan, brown or black, or is sometimes white, red, or blue.       D = Diameter  Melanomas usually greater than 6mm (the size of a pencil eraser) when diagnosed, but they can be smaller.       E = Evolving  A mole or skin lesion that looks different from the rest or is changing in size, shape, or color.    ABCDEs of melanoma     A = Asymmetry  One half is unlike the other half.        B = Border  An irregular, scalloped, or poorly defined border.        C = Color  Is varied from one area to another; has shades of tan, brown or black, or is sometimes white, red, or blue.        D = Diameter  Melanomas usually greater than 6mm (the size of a pencil eraser) when diagnosed, but they can be smaller.              E = Evolving  A mole or skin lesion that looks different from the rest or is changing in size, shape, or color.         !! If you see a mole or new spot on your skin that has any of the ABCDEs, immediately make an appointment to see a dermatologist.    Signs of melanoma  The most common early signs (what you see) of melanoma are:     Growing mole on your skin.   Unusual looking mole on your skin or a mole that does not look like any other mole on your skin (the ugly duckling).   Non-uniform mole (has an odd shape, uneven or uncertain border, different colors).     Symptoms of melanoma  In the early stages, melanoma may not cause any symptoms (what you feel). But sometimes melanoma will:    Itch.    Bleed.    Feel painful.   Many melanomas have these signs and symptoms, but not all. There are different types of melanoma. One type can first appear as a brown or black streak underneath a fingernail or toenail. Melanoma also can look like a bruise that just won?t heal.     Who gets melanoma?  Anyone can get melanoma. Most people who get it have light skin, but people who have brown and black skin also get melanoma.   Some people have a higher risk of getting melanoma. These people have the following  traits:   Skin    Fair skin (The risk is higher if the person also has red or blond hair and blue or green eyes).    Sun-sensitive skin (rarely tans or burns easily).    50-plus moles, large moles, or unusual-looking moles.    If you have had bad sunburns or spent time tanning (sun, tanning beds, or sun lamps), you also have a higher risk of getting melanoma.   Men older than 50 are at a higher risk for developing skin cancers, including melanoma. Learning how to check your skin and getting skin exams can help detect skin cancer.    Family/medical history   Melanoma runs in the family (parent, child, sibling, cousin, aunt, uncle had melanoma).   You had another skin cancer, but most especially another melanoma.   A weakened immune system.      Research shows that indoor tanning increases a person's melanoma risk by 75%. The risk also may increase if you had breast or thyroid cancer.    More people getting melanoma  Fewer people are getting most types of cancer. Melanoma is different. More people are getting melanoma. Many are white men who are 50 years or older. More young people also are getting melanoma. Melanoma is now the most common cancer among people 25-29 years old. Even teenagers are getting melanoma.    What causes melanoma?  Ultraviolet (UV) radiation is a major contributor in most cases. We get UV radiation from the sun, tanning beds, and sun lamps. Heredity also plays a role. Research shows that if a close blood relative (parent, child, sibling, aunt, uncle) had melanoma, a person has a much greater risk of getting melanoma.     How do dermatologists diagnose melanoma?  To diagnose melanoma, a dermatologist begins by looking at the patient?s skin. A dermatologist will carefully examine moles and other suspicious spots. To get a better look, a dermatologist may use a device called a dermoscope.      Vitamin D  Our bodies need vitamin D to build strong and healthy bones. Vitamin D helps the body absorb the calcium that our bones require.     For a healthy person, the recommended daily dietary allowance is 600 international units for people of 1-70 years old, and 800 international units for people older than 71 years. More vitamin D is not better. Higher amounts of vitamin D could be harmful, leading to many health problems such as high blood pressure and kidney damage.     American academy of Dermatology recommending everyone get vitamin D from foods naturally rich in vitamin D, foods and beverages fortified with vitamin D or vitamin D supplements. The foods that contain the greatest amount are fatty fishes such as salmon, tuna and mackerel. Fish liver oil is another good source.     One of the sources to look up vitamin amount is through National Agriculture library. Http://ndb.nal.usda.gov/. This can help you find out whether you get enough vitamin D from your diet. If you are like many people, you may not be getting your recommended dietary allowance of vitamin D. You may want to change the foods that you eat or take a vitamin D supplements. Before you start taking a vitamin D supplement, talk with your doctor.     Vitamin D is produced in the skin by UV light, but the amount is highly variable and depends on many factors. However, getting vitamin D from the sun or tanning beds can 1) increase   your risk of developing skin cancer including melanoma which can be deadly, 2) resulting premature skin aging (wrinkles, age spots, blotchy complexion) and 3) leading to a weakened immune system. Therefore, American academy of Dermatology recommend getting vitamin D safely from foods, beverages and supplements.

## 2023-02-23 NOTE — Progress Notes
Date of Service: 02/23/2023    Subjective:             Regina Dean is a 67 y.o. female.    History of Present Illness    Return patient, last visit on 08/18/2022. Patient present in clinic today for 6 month follow-up to repeat full body skin examination. Due to language barrier, a telephonic spanish interpreter was used during the entire duration of this visit. Interpreter ID number: [id number].    1. Patient has a history of brown and tan spots distributed over the head, trunk, arms and legs  - present for many years and get darker with sun exposure  - history of blistering sunburns, has never used a tanning bed     2. History of basal cell carcinoma  - left infraorbital cheek s/p Mohs performed by Dr. Lowell Guitar on 10/05/2021    3. History of skin cancer of unknown type  - right nose s/p excision with graft placement at outside facility ~2000 (per patient, outside records unavailable)  - left nose s/p excision with graft placement at outside facility ~2000 (per patient, outside records unavailable)    Personal history: basal cell carcinoma and skin cancers of unknown type as detailed above  Family history: no known family history of skin cancer       Review of Systems   Constitutional:  Negative for activity change, appetite change, chills, diaphoresis, fatigue, fever and unexpected weight change.         Objective:          acetaminophen (TYLENOL) 325 mg tablet Take two tablets by mouth every 6 hours as needed for Pain.    atorvastatin (LIPITOR) 40 mg tablet Take one tablet by mouth at bedtime daily.    ibuprofen (MOTRIN) 600 mg tablet Take one tablet by mouth every 6 hours as needed. Take with food.    JANUVIA 100 mg tab tablet Take one tablet by mouth daily after lunch.    metFORMIN (GLUCOPHAGE) 850 mg tablet Take one tablet by mouth twice daily with meals.    oxyCODONE (ROXICODONE) 5 mg tablet Take one tablet by mouth every 4 hours as needed.    polyethylene glycol 3350 (MIRALAX) 17 g packet Take one packet by mouth daily.    sennosides-docusate sodium (SENOKOT-S) 8.6/50 mg tablet Take one tablet by mouth twice daily.    simethicone (MYLICON) 80 mg chew tablet Chew one tablet by mouth every 6 hours as needed for Flatulence.     Vitals:    02/23/23 0943   Resp: 16   PainSc: Zero   Weight: 60.8 kg (134 lb)   Height: 167.6 cm (5' 6)     Body mass index is 21.63 kg/m?Marland Kitchen     Physical Exam  Areas Examined (all normal unless noted below):  Head/Face  Neck  Chest/breasts/axillae  Back  Abdomen  Buttocks/groin- patient declined evaluation of groin/genitals on exam today  R upper ext  L upper ext  R lower ext  L lower ext    Pertinent findings include:    Multiple brown and tan evenly pigmented macules are distributed over examined areas. All have symmetric similar dermascopic findings with primarily pebbled and reticular patterns.    Scar at site of prior basal cell carcinoma on left infraorbital cheek is noted with no evidence of recurrence.      Scars at sites of prior skin cancers of unknown type on right nose and left nose are noted with no evidence of recurrence.  1 erythematous scaly papule is noted on the right cheek.    Soft, pigmented, stuck-on-appearing papules are distributed over examined areas. All have symmetric pebbled dermoscopic findings. 1 on right temple is surrounded by linearly arranged erthematous macules and patches consistent with excoriation.    Bright red, pink macules and papules distributed over torso and extremities    Reticulated hyperpigmented macules distributed on sun exposed areas of face and bilateral arms       Assessment and Plan:    1. Melanocytic nevi  - will continue to monitor  - RTC for new/changing lesions  - counseled on ABCD's of melanoma and provided handout information in after visit summary  - counseled on sunscreen SPF 30+ with zinc oxide or titanium dioxide     2. Unremarkable scar at site of prior basal cell carcinoma  - will continue to monitor for recurrence    3. Unremarkable scar at sites of prior skin cancers of unknown type  - will continue to monitor for recurrence    4. Actinic keratosis   - we discussed that these are a marker of sun damage and if remained untreated, may eventually become skin cancer.  - liquid nitrogen for 2 freeze-to-thaw cycles applied to 1 lesion as detailed in procedure note below  - counseled on photoprotection and sun protective behavior, signs and symptoms of NMSC, ABCDEs of melanoma  - encouraged patient to contact clinic for any new or changing lesions     5. Seborrheic keratoses  - provided reassurance for benign nature of lesions on exam today  - LN2 f/t/f x 1 lesion as detailed in procedure note below    6. Cherry angiomas  - provided reassurance for benign nature on exam today  - will continue to monitor    7. Solar lentigines  - provided reassurance of benign nature of lesions on exam today  - will continue to monitor for ABCD changes  - informed patient that these are associated with history of UV exposure  - encouraged photoprotection and use of SPF 30 or greater physical blocker sunscreen with zinc oxide or titanium dioxide     LIQUID NITROGEN PROCEDURE NOTE  Risk and benefits of the above procedure including pain, dyspigmentation, scar, infection, recurrence were discussed with the patient (or legal guardian) in detail, who afterwards decided to proceed with the procedure. Verbal informed consent given and received. Patient was instructed to close eyes and eyes were shielded for the duration of this procedure.   Diagnosis: actinic keratosis  Body site: right cheek  Number of lesions: 1  Cycle duration: 10  Number or cycles: 2  Wound care instructions given: verbal  Complications: none  Tolerated well: yes  Ambulated from room: yes  Duration of procedure: <5 min    LIQUID NITROGEN PROCEDURE NOTE  Risk and benefits of the above procedure including pain, dyspigmentation, scar, infection, recurrence were discussed with the patient (or legal guardian) in detail, who afterwards decided to proceed with the procedure. Verbal informed consent given and received. Patient was instructed to close eyes and eyes were shielded for the duration of this procedure.  Diagnosis: seborrheic keratosis  Body site: 1  Number of lesions: right temple  Cycle duration: 10  Number or cycles: 2  Wound care instructions given: verbal  Complications: none  Tolerated well: yes  Ambulated from room: yes  Duration of procedure: <5 min      RTC 1 year or sooner as needed.  In the presence of Sula Soda, MD,  I have taken down these notes, Romero Liner, Scribe. 02/23/2023 10:01 AM

## 2023-03-04 ENCOUNTER — Encounter: Admit: 2023-03-04 | Discharge: 2023-03-04 | Payer: No Typology Code available for payment source | Primary: Family

## 2023-03-04 ENCOUNTER — Encounter: Admit: 2023-03-04 | Discharge: 2023-03-04 | Payer: Medicaid Other | Primary: Family

## 2023-03-04 ENCOUNTER — Ambulatory Visit: Admit: 2023-03-04 | Discharge: 2023-03-05 | Payer: Medicaid Other | Primary: Family

## 2023-03-04 DIAGNOSIS — E119 Type 2 diabetes mellitus without complications: Secondary | ICD-10-CM

## 2023-03-04 DIAGNOSIS — E785 Hyperlipidemia, unspecified: Secondary | ICD-10-CM

## 2023-03-04 DIAGNOSIS — C4491 Basal cell carcinoma of skin, unspecified: Secondary | ICD-10-CM

## 2023-03-04 DIAGNOSIS — R079 Chest pain, unspecified: Secondary | ICD-10-CM

## 2023-03-04 DIAGNOSIS — Z4889 Encounter for other specified surgical aftercare: Secondary | ICD-10-CM

## 2023-03-04 NOTE — Progress Notes
Post-op Visit  HPI:  Silena Wyss is 12 weeks post-operative from a robot-assisted laparoscopic supracervical hysterectomy, bilateral salpingo-oophorectomy, sacrocolpopexy, posterior colporrhaphy, cystourethroscopy on 12/02/22.    Her convalescence has been unremarkable.     Today she denies presence of any bothersome pain / pressure, vaginal discharge or bleeding.  She denies difficulty with micturition or UTI symptoms.  She does feel that she empties her bladder well.     She denies constipation or defecatory dysfunction.      She has not been sexually active.       OBJECTIVE:  Vitals:  Most Recent :  Vitals:    03/04/23 1437   BP: 137/85   BP Source: Arm, Right Upper   Pulse: 75   Temp: 37 ?C (98.6 ?F)   Resp: 15   SpO2: 100%   TempSrc: Oral   PainSc: Zero   Weight: 64.1 kg (141 lb 6.4 oz)   Height: 165.1 cm (5' 5)       Physical Exam:  CONSTITUTIONAL:   Well developed.  Well nourished.   HEENT:  Thyroid nontender/not enlarged.  RESPIRATORY: Clear to auscultation bilaterally. Normal respiratory effort.  CARDIOVASCULAR: Regular rate and rhythm.  Normal heart sounds.  No murmurs.  Normal peripheral vascular exam.  ABDOMINAL:  Abdomen, soft and nontender. Incisions well-healed  SKIN:   Normal inspection.  MUSCULOSKELETAL: No deformities/weakness. Moves all extremities well.   NEUROLOGIC:  Normal reflexes.  PSYCHIATRIC:  Oriented X 3.    GENITOURINARY:  External Genitalia:  Normal appearance, no lesions.    Urethral Meatus: Normal size and location.  Urethra:  No masses or tenderness.  Bladder:  No masses or tenderness.  Vagina: Well-healed  Cervix: No lesions  Uterus: Surgically absent  Adnexa/Parametrial:  No palpable masses, no tenderness.   Perineal Sensation: Normal     POPQ Examination:    GH: 1.5 GHs: 2  PB: 3 PBs: 3.5  Aa: -3  Ba: -3  C: -10.5  D: -10.5  TVL:  10.5  Ap: -2.5  Bp:  -2.5    Pelvic floor myofascial examination:    Right OI:  0/10  Right pubococcygeus:  0/10  Right puborectalis: 3/10  Posterior:   0/10  Left puborectalis:  0/10  Left pubococcygeus:  0/10  Left OI:   2/10  Bladder:   0/10  Urethra:   0/10    ASSESSMENT:  Porfiria Heinrich is a 67 y.o. female who is 12 weeks post-operative from a robot-assisted laparoscopic supracervical hysterectomy, bilateral salpingo-oophorectomy, sacrocolpopexy, posterior colporrhaphy, cystourethroscopy on 12/02/22.    PLAN:   - Doing well, may resume regular activities  - Follow up in 1 year. Discussed we will do Pap at that time and if normal, she will be done with Paps.

## 2023-08-05 ENCOUNTER — Encounter: Admit: 2023-08-05 | Discharge: 2023-08-05 | Payer: No Typology Code available for payment source | Primary: Family

## 2023-08-05 ENCOUNTER — Ambulatory Visit: Admit: 2023-08-05 | Discharge: 2023-08-06 | Payer: Medicaid Other | Primary: Family

## 2023-08-05 DIAGNOSIS — R079 Chest pain, unspecified: Secondary | ICD-10-CM

## 2023-08-05 DIAGNOSIS — M180 Bilateral primary osteoarthritis of first carpometacarpal joints: Secondary | ICD-10-CM

## 2023-08-05 DIAGNOSIS — E785 Hyperlipidemia, unspecified: Secondary | ICD-10-CM

## 2023-08-05 DIAGNOSIS — C4491 Basal cell carcinoma of skin, unspecified: Secondary | ICD-10-CM

## 2023-08-05 DIAGNOSIS — E119 Type 2 diabetes mellitus without complications: Secondary | ICD-10-CM

## 2023-08-05 MED ORDER — LIDOCAINE (PF) 10 MG/ML (1 %) IJ SOLN
1 mL | Freq: Once | INTRAMUSCULAR | 0 refills | Status: CP | PRN
Start: 2023-08-05 — End: ?

## 2023-08-05 MED ORDER — TRIAMCINOLONE ACETONIDE 40 MG/ML IJ SUSP
40 mg | Freq: Once | INTRAMUSCULAR | 0 refills | Status: CP | PRN
Start: 2023-08-05 — End: ?

## 2023-08-05 MED ORDER — NAPROXEN 500 MG PO TAB
500 mg | ORAL_TABLET | Freq: Two times a day (BID) | ORAL | 0 refills | Status: AC
Start: 2023-08-05 — End: ?

## 2023-08-05 NOTE — Progress Notes
[facility] Hand Surgery      Date of Service: 08/05/2023    Subjective:           Bilateral thumb pain    History of Present Illness  This is a 67 year old female accompanied by an adult female who translates in Spanish for her.  I offered a video interpreter which they declined.  She is following up after previous treatment for her bilateral thumb CMC joint arthritis.  We initiated conservative care at the last visit with steroid injections and MetaGrip bracing.  She says this was very helpful and she continues to use the braces.  Her pain symptoms have recently returned.  They are localized to the base of the thumbs and mainly occur with use of the hands.  She denies any sensory disturbances.  Regina Dean is a 67 y.o. female.     Review of Systems      Objective:          acetaminophen (TYLENOL) 325 mg tablet Take two tablets by mouth every 6 hours as needed for Pain.    atorvastatin (LIPITOR) 40 mg tablet Take one tablet by mouth at bedtime daily.    ibuprofen (MOTRIN) 600 mg tablet Take one tablet by mouth every 6 hours as needed. Take with food.    JANUVIA 100 mg tab tablet Take one tablet by mouth daily after lunch.    metFORMIN (GLUCOPHAGE) 850 mg tablet Take one tablet by mouth twice daily with meals.    oxyCODONE (ROXICODONE) 5 mg tablet Take one tablet by mouth every 4 hours as needed.    polyethylene glycol 3350 (MIRALAX) 17 g packet Take one packet by mouth daily.    sennosides-docusate sodium (SENOKOT-S) 8.6/50 mg tablet Take one tablet by mouth twice daily.    simethicone (MYLICON) 80 mg chew tablet Chew one tablet by mouth every 6 hours as needed for Flatulence.     Vitals:    08/05/23 1136   PainSc: Eight     There is no height or weight on file to calculate BMI.     Physical Exam  Constitutional:       General: She is not in acute distress.     Appearance: Normal appearance.   Neurological:      Mental Status: She is oriented to person, place, and time. Psychiatric:         Behavior: Behavior normal.       Ortho Exam  Bilateral hands: The skin is intact, there are no wounds or scars.  She has some swelling and enlargement over the [facility] joints of her bilateral thumbs with a positive CMC grind test and tenderness in those areas.  She is otherwise full range of motion of the wrist and digits.  She has intact sensation and brisk cap refill to all digits.       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  We discussed the possibility of surgical management, we also discussed trying another round of injections.  She would like to try more injections and also add in some anti-inflammatory medicines.  We will see her back on an as-needed basis.    Small Joint Injection/Aspiration: R thumb CMC on 08/05/2023 11:50 AM    Consent:   Consent obtained: verbal  Consent given by: patient  Risks discussed: skin discoloration, bleeding, damage to surrounding structures, hyperglycemia, infection, pain, soft tissue reaction, subcutaneous fat atrophy, tendon rupture, vasovagal reaction  and arrhythmia  Alternatives discussed: alternative treatment, delayed treatment and no treatment  Discussed with patient the purpose of the treatment/procedure, other ways of treating my condition, including no treatment/ procedure and the risks and benefits of the alternatives. Patient has decided to proceed with treatment/procedure.        Universal Protocol:  Relevant documents: relevant documents present and verified  Patient identity confirmed: Patient identify confirmed verbally with patient.          Procedures Details:  Procedure Peformed: Injection Only  Indications: pain  Details:Prep: alcohol   25 G needle, dorsal approachMedications: 40 mg triamcinolone acetonide 40 mg/mL; 1 mL lidocaine PF 1% (10 mg/mL)  Outcome: tolerated well, no immediate complications                             Philomena Course, MD  Associate Professor  Orthopedic Hand Surgery

## 2023-08-05 NOTE — Progress Notes
Small Joint Injection/Aspiration: L thumb CMC on 08/05/2023 11:50 AM    Consent:   Consent obtained: verbal  Consent given by: patient  Risks discussed: skin discoloration, bleeding, damage to surrounding structures, hyperglycemia, infection, pain, soft tissue reaction, subcutaneous fat atrophy, tendon rupture, vasovagal reaction and arrhythmia  Alternatives discussed: alternative treatment, delayed treatment and no treatment  Discussed with patient the purpose of the treatment/procedure, other ways of treating my condition, including no treatment/ procedure and the risks and benefits of the alternatives. Patient has decided to proceed with treatment/procedure.        Universal Protocol:  Relevant documents: relevant documents present and verified  Patient identity confirmed: Patient identify confirmed verbally with patient.          Procedures Details:  Procedure Peformed: Injection Only  Indications: pain  Details:Prep: alcohol   25 G needle, dorsal approachMedications: 40 mg triamcinolone acetonide 40 mg/mL; 1 mL lidocaine PF 1% (10 mg/mL)  Outcome: tolerated well, no immediate complications

## 2024-02-17 ENCOUNTER — Encounter: Admit: 2024-02-17 | Discharge: 2024-02-17 | Payer: MEDICARE | Primary: Family

## 2024-02-17 ENCOUNTER — Ambulatory Visit: Admit: 2024-02-17 | Discharge: 2024-02-18 | Payer: MEDICARE | Primary: Family

## 2024-02-17 DIAGNOSIS — M180 Bilateral primary osteoarthritis of first carpometacarpal joints: Secondary | ICD-10-CM

## 2024-02-17 MED ORDER — TRIAMCINOLONE ACETONIDE 40 MG/ML IJ SUSP
40 mg | Freq: Once | INTRAMUSCULAR | 0 refills | Status: CP | PRN
Start: 2024-02-17 — End: ?

## 2024-02-17 MED ORDER — LIDOCAINE (PF) 10 MG/ML (1 %) IJ SOLN
1 mL | Freq: Once | INTRAMUSCULAR | 0 refills | Status: CP | PRN
Start: 2024-02-17 — End: ?

## 2024-02-17 NOTE — Progress Notes
 [facility] Hand Surgery      Date of Service: 02/17/2024    Subjective:           Bilateral thumb pain    History of Present Illness  This is a 68 year old female have seen in the past for bilateral thumb CMC arthritis.  She is accompanied by another adult female today who helps translate for her at her request.  She says the last round of injections we gave her were very helpful.  Her pain symptoms have returned around the base of her bilateral thumbs.  She has been using her MetaGrip braces but they are wearing out.  She reports pain with use of her hands.  She denies any sensory disturbances.  Regina Dean is a 68 y.o. female.     Review of Systems      Objective:          acetaminophen (TYLENOL) 325 mg tablet Take two tablets by mouth every 6 hours as needed for Pain.    atorvastatin (LIPITOR) 40 mg tablet Take one tablet by mouth at bedtime daily.    ibuprofen (MOTRIN) 600 mg tablet Take one tablet by mouth every 6 hours as needed. Take with food.    JANUVIA 100 mg tab tablet Take one tablet by mouth daily after lunch.    metFORMIN (GLUCOPHAGE) 850 mg tablet Take one tablet by mouth twice daily with meals.    oxyCODONE (ROXICODONE) 5 mg tablet Take one tablet by mouth every 4 hours as needed.    polyethylene glycol 3350 (MIRALAX) 17 g packet Take one packet by mouth daily.    sennosides-docusate sodium (SENOKOT-S) 8.6/50 mg tablet Take one tablet by mouth twice daily.    simethicone (MYLICON) 80 mg chew tablet Chew one tablet by mouth every 6 hours as needed for Flatulence.     There were no vitals filed for this visit.  There is no height or weight on file to calculate BMI.     Physical Exam  Constitutional:       General: She is not in acute distress.     Appearance: Normal appearance.   Neurological:      Mental Status: She is oriented to person, place, and time.   Psychiatric:         Behavior: Behavior normal.       Ortho Exam  Bilateral hands: The skin is intact, there are no wounds or scars.  There is some mild swelling around her bilateral thumb CMC joints.  She has tenderness with light palpation of the bilateral thumbs CMC joints and really does not tolerate any provocative testing.  When I asked her to make a full fist for me she actually reacts with significant pain symptoms and withdraws.  She has intact sensation and brisk cap refill to all digits.       Assessment and Plan:  She has ongoing symptomatic bilateral thumb CMC joints with arthritis.  I counseled her we could consider further conservative care with another round of injections and get her some new MetaGrip bracing.  This has been helpful for her in the past.  Should these measures fail we could consider surgical treatment but I am optimistic she will not need this in the future.  She is welcome to follow-up on an as-needed basis.    Small Joint Injection/Aspiration: R thumb CMC on 02/17/2024 10:20 AM    Consent:   Consent obtained: verbal  Consent given by: patient  Risks discussed: skin discoloration, bleeding, damage to surrounding structures, hyperglycemia, infection, pain, soft tissue reaction, subcutaneous fat atrophy, tendon rupture, vasovagal reaction and arrhythmia  Alternatives discussed: alternative treatment, delayed treatment and no treatment  Discussed with patient the purpose of the treatment/procedure, other ways of treating my condition, including no treatment/ procedure and the risks and benefits of the alternatives. Patient has decided to proceed with treatment/procedure.        Universal Protocol:  Relevant documents: relevant documents present and verified  Patient identity confirmed: Patient identify confirmed verbally with patient.          Procedures Details:  Procedure Peformed: Injection Only  Indications: pain  Details:Prep: alcohol   25 G needle, dorsal approachMedications: 40 mg triamcinolone acetonide 40 mg/mL; 1 mL lidocaine PF 1% (10 mg/mL)  Outcome: tolerated well, no immediate complications                             Philomena Course, MD  Associate Professor  Orthopedic Hand Surgery

## 2024-02-17 NOTE — Progress Notes
 Small Joint Injection/Aspiration: L thumb CMC on 02/17/2024 10:20 AM    Consent:   Consent obtained: verbal  Consent given by: patient  Risks discussed: skin discoloration, bleeding, damage to surrounding structures, hyperglycemia, infection, pain, soft tissue reaction, subcutaneous fat atrophy, tendon rupture, vasovagal reaction and arrhythmia  Alternatives discussed: alternative treatment, delayed treatment and no treatment  Discussed with patient the purpose of the treatment/procedure, other ways of treating my condition, including no treatment/ procedure and the risks and benefits of the alternatives. Patient has decided to proceed with treatment/procedure.        Universal Protocol:  Relevant documents: relevant documents present and verified  Patient identity confirmed: Patient identify confirmed verbally with patient.          Procedures Details:  Procedure Peformed: Injection Only  Indications: pain  Details:Prep: alcohol   25 G needle, dorsal approachMedications: 40 mg triamcinolone acetonide 40 mg/mL; 1 mL lidocaine PF 1% (10 mg/mL)  Outcome: tolerated well, no immediate complications

## 2024-02-17 NOTE — Patient Instructions
 It was a pleasure seeing you today. Please contact us if you have any further questions, we are happy to assist.     For nursing/medical/casting or splinting questions, please send a message through MyChart. This is our preferred method of contact and the most efficient way to contact your healthcare team. If you do not have internet access/smartphone you can call my Clinical Nurse Coordinator at [phone]. Please do not leave multiple voicemail's for [place]. Leaving multiple voicemail's delays [place] getting back to you quicker.    NOTE: MyChart messages and phone calls received on weekends, on holidays, and after 4 pm on weekdays will NOT be seen until the following business day.     - For medication refills, please ask your pharmacy to send an electronic request.  Please allow at least 3 business days for medication refills.    - To cancel, change, or schedule a clinic appointment: Call  Scheduling at [phone].  - For any radiology concerns or scheduling, please call [phone].     Any insurance, disability or Family Medical Leave Act [facility]) forms can be faxed to [phone].  Be sure to include your name and date of birth on the form. We will complete and fax the forms where they need to go as soon as we are able. For any questions about disability or FMLA paperwork, please contact Dr. Darden Dates administrative assistant, Karna Christmas at [phone].    Do not hesitate to contact our office with any further questions.Thank you and have a great day!    Philomena Course, MD   [facility]   Orthopedic & Sports Medicine  Office: [phone]  Fax: [phone]

## 2024-03-08 ENCOUNTER — Encounter: Admit: 2024-03-08 | Discharge: 2024-03-08 | Payer: MEDICARE | Primary: Family

## 2024-03-08 ENCOUNTER — Ambulatory Visit: Admit: 2024-03-08 | Discharge: 2024-03-09 | Payer: MEDICAID | Primary: Family

## 2024-03-08 DIAGNOSIS — Z0000 Encounter for general adult medical examination without abnormal findings: Secondary | ICD-10-CM

## 2024-03-08 NOTE — Progress Notes
 CHIEF COMPLAINT:   Chief Complaint   Patient presents with    Follow Up     1 yr f/u w/ PAP       History of Present Illness:   Regina Dean is a 68 y.o., female who presents for follow up.    She has no complaints today.    She is s/p robot-assisted laparoscopic supracervical hysterectomy, bilateral salpingo-oophorectomy, sacrocolpopexy, posterior colporrhaphy, cystourethroscopy on 12/02/22.     ALLERGIES:  Gabapentin    MEDICATIONS:    Current Outpatient Medications:     atorvastatin (LIPITOR) 40 mg tablet, Take one tablet by mouth at bedtime daily., Disp: , Rfl:     JANUVIA 100 mg tab tablet, Take one tablet by mouth daily after lunch., Disp: , Rfl:     metFORMIN (GLUCOPHAGE) 850 mg tablet, Take one tablet by mouth twice daily with meals., Disp: , Rfl:     Past Medical History:    Basal cell carcinoma of skin, site unspecified    Chest pain    DM (diabetes mellitus) (CMS-HCC)    Hyperlipemia       Surgical History:   Procedure Laterality Date    LEFT TOTAL HIP ARTHROPLASTY Left 02/21/2020    Performed by Sula Rumple, MD at [facility] OR    RIGHT TOTAL HIP ARTHROPLASTY Right 05/01/2020    Performed by Sula Rumple, MD at [facility] OR    COLONOSCOPY DIAGNOSTIC WITH SPECIMEN COLLECTION BY BRUSHING/ WASHING - FLEXIBLE N/A 07/15/2020    Performed by Samuel Jester, MD at [facility] OR    COLONOSCOPY WITH SNARE REMOVAL TUMOR/ POLYP/ OTHER LESION N/A 07/15/2020    Performed by Samuel Jester, MD at [facility]    ROBOT ASSISTED LAPAROSCOPIC SUPRACERVICAL HYSTERECTOMY WITH REMOVAL OF BILATERAL TUBES AND OVARIES - UTERUS 250 G OR LESS N/A 12/02/2022    Performed by Shirley Friar, MD at [facility] OR    ROBOT ASSISTED LAPAROSCOPIC COLPOPEXY N/A 12/02/2022    Performed by Shirley Friar, MD at [facility] OR    CYSTOURETHROSCOPY N/A 12/02/2022    Performed by Shirley Friar, MD at [facility] OR    POSTERIOR COLPORRHAPHY REPAIR OF RECTOCELE WITH PERINEORRHAPHY N/A 12/02/2022    Performed by Shirley Friar, MD at [facility] OR    HX TONSILLECTOMY      TUBAL LIGATION         Family History   Problem Relation Name Age of Onset    Unknown to Patient Mother      Unknown to Patient Father      Amblyopia Neg Hx      Autoimmune Disease Neg Hx      Blindness Neg Hx      Coronary Artery Disease Neg Hx      Cancer Neg Hx      Cataract Neg Hx      Diabetes Neg Hx      Glaucoma Neg Hx      Hypertension Neg Hx      Macular Degen Neg Hx      Neurologic Disorder Neg Hx      Retinal Detachment Neg Hx      Strabismus Neg Hx      Stroke Neg Hx      Thyroid Disease Neg Hx         Social History     Tobacco Use    Smoking status: Never    Smokeless tobacco: Never   Substance Use Topics  Alcohol use: Not Currently     Comment: occ           OBJECTIVE:    Physical Examination:  General appearance/Vital Signs: BP 139/78 (BP Source: Arm, Left Upper, Patient Position: Sitting)  - Pulse 95  - Temp 97.5 ?F (36.4 ?C) (Temporal)  - Resp 16  - Ht 167.6 cm (5' 6)  - Wt 63.6 kg (140 lb 3.2 oz)  - SpO2 99%  - BMI 22.63 kg/m? , not in acute distress, well groomed  Eyes: Sclera white, no nystagmus  Ears/Nose/Throat: Lips and gums appear normal,adequate hearing ability   Psychiatric / Mental status: oriented to time, place and person; affect and mood appropriate; normal interaction  Cardiovascular / Peripheral vascular system: no prominent varicose veins involving the LE, no cyanosis, no visible edema in LE  Chest / Respiratory: normal respiratory effort, unlabored breathing  Musculoskeletal / Extremities: walking with normal gait, normal joint mobility noted visibly in LE bilat  Gastrointestinal / Lower Abdomen: no direct tenderness, soft, nondistended  Lymphatic: No lymphadenopathy of the groin on both sides  Dermatological: suprapubic and vulvar skin inspected visually without any new lesions noted, suprapubic and vulvar skin palpated with no masses felt  Neurologic: sensation over pubis normal, sensation over labia minora normal   Pelvic:  - Ext. Genitalia - No visible lesions noted involving the labia majora or minora   - Urethra: midline, non-erythematous, no prolapse seen  - Vagina: atrophic appearance, no lesions  - Cervix: no lesions  - Uterus: surgically absent  - Adnexa: no masses  - Perineum: normal appearance    POPQ Examination:    GH: 2 GHs: 2.5  PB: 3.5 PBs: 3.5  Aa: -3  Ba: -3  C: -10  D: -10  TVL:  10  Ap: -2  Bp:  -2    Pelvic floor myofascial examination:    Right OI:  0/10  Right pubococcygeus:  0/10  Right puborectalis:  0/10  Posterior:   0/10  Left puborectalis:  0/10  Left pubococcygeus:  0/10  Left OI:   0/10  Bladder:   0/10  Urethra:   0/10  L vulva:   0/10  R vulva:   0/10    ASSESSMENT:  Regina Dean is a 68 y.o. female seen for follow up. She is doing well without complaint.    Assessment & Plan  Healthcare maintenance  - Doing well, Pap (co-test) collected today. Discussed that, if normal, she will not need future Paps.    Follow up PRN

## 2024-03-19 ENCOUNTER — Encounter: Admit: 2024-03-19 | Discharge: 2024-03-19 | Payer: MEDICARE | Primary: Family

## 2024-07-30 ENCOUNTER — Encounter: Admit: 2024-07-30 | Discharge: 2024-07-30 | Payer: MEDICARE | Primary: Family

## 2024-07-30 ENCOUNTER — Ambulatory Visit: Admit: 2024-07-30 | Discharge: 2024-07-31 | Payer: MEDICARE | Primary: Family

## 2024-07-30 DIAGNOSIS — M180 Bilateral primary osteoarthritis of first carpometacarpal joints: Principal | ICD-10-CM

## 2024-07-30 MED ORDER — TRIAMCINOLONE ACETONIDE 40 MG/ML IJ SUSP
40 mg | Freq: Once | INTRAMUSCULAR | 0 refills | Status: CP | PRN
Start: 2024-07-30 — End: ?

## 2024-07-30 MED ORDER — LIDOCAINE (PF) 10 MG/ML (1 %) IJ SOLN
1 mL | Freq: Once | INTRAMUSCULAR | 0 refills | Status: CP | PRN
Start: 2024-07-30 — End: ?

## 2024-07-30 NOTE — Progress Notes
 [facility] Hand [facility]      Date of Service: 07/30/2024    Subjective:           Bilateral thumb pain    History of Present Illness  The patient returns with ongoing pain symptoms around the base of her thumbs.  We have been managing her conservatively for bilateral thumb CMC arthritis.  Injections have been very helpful.  Our last injection was 6 months ago.  She has had recent return of pain symptoms around the base of both thumbs with the right being worse than the left.  She is requesting more treatment.  Regina Dean is a 68 y.o. female.     Review of Systems      Objective:          atorvastatin  (LIPITOR) 40 mg tablet Take one tablet by mouth at bedtime daily.    JANUVIA  100 mg tab tablet Take one tablet by mouth daily after lunch.    metFORMIN  (GLUCOPHAGE ) 850 mg tablet Take one tablet by mouth twice daily with meals.     Vitals:    07/30/24 1107   PainSc: Four     There is no height or weight on file to calculate BMI.     Physical Exam  Constitutional:       General: She is not in acute distress.     Appearance: Normal appearance.   Neurological:      Mental Status: She is oriented to person, place, and time.   Psychiatric:         Behavior: Behavior normal.       Ortho Exam  Bilateral hands: The skin is intact, there are no wounds or scars.  There is some swelling and enlargement of her bilateral thumb CMC joints with tenderness and a positive CMC grind test.  She has otherwise full range of motion of the bilateral wrist and digits.  There is intact sensation and brisk cap refill to all digits.       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  I counseled her we could try more steroid injections or consider [facility].  She prefers to try further injection treatments.  She is welcome to follow-up on an as-needed basis.    Small Joint Drain/Inject: R thumb CMC on 07/30/2024 11:50 AM    Consent:   Consent obtained: verbal  Consent given by: patient  Risks discussed: skin discoloration, bleeding, damage to surrounding structures, hyperglycemia, infection, pain, soft tissue reaction, subcutaneous fat atrophy, tendon rupture, vasovagal reaction and arrhythmia  Alternatives discussed: alternative treatment, delayed treatment and no treatment  Discussed with patient the purpose of the treatment/procedure, other ways of treating my condition, including no treatment/ procedure and the risks and benefits of the alternatives. Patient has decided to proceed with treatment/procedure.        Universal Protocol:  Relevant documents: relevant documents present and verified  Patient identity confirmed: Patient identify confirmed verbally with patient.          Procedures Details:  Procedure Peformed: Injection Only  Indications: pain  Details:Prep: alcohol   25 G needle, dorsal approachMedications: 1 mL lidocaine  PF 1% (10 mg/mL); 40 mg triamcinolone  acetonide 40 mg/mL  Outcome: tolerated well, no immediate complications                             Donnice  Roddie, MD  Associate Professor  Orthopedic Hand Surgery

## 2024-07-30 NOTE — Patient Instructions
 It was a pleasure seeing you today. Please contact us if you have any further questions, we are happy to assist.     For nursing/medical/casting or splinting questions, please send a message through MyChart. This is our preferred method of contact and the most efficient way to contact your healthcare team. If you do not have internet access/smartphone you can call my Clinical Nurse Coordinator at [phone]. Please do not leave multiple voicemail's for [place]. Leaving multiple voicemail's delays [place] getting back to you quicker.    NOTE: MyChart messages and phone calls received on weekends, on holidays, and after 4 pm on weekdays will NOT be seen until the following business day.     - For medication refills, please ask your pharmacy to send an electronic request.  Please allow at least 3 business days for medication refills.    - To cancel, change, or schedule a clinic appointment: Call  Scheduling at [phone].  - For any radiology concerns or scheduling, please call [phone].     Any insurance, disability or Family Medical Leave Act [facility]) forms can be faxed to [phone].  Be sure to include your name and date of birth on the form. We will complete and fax the forms where they need to go as soon as we are able. For any questions about disability or FMLA paperwork, please contact Dr. Darden Dates administrative assistant, Karna Christmas at [phone].    Do not hesitate to contact our office with any further questions.Thank you and have a great day!    Philomena Course, MD   [facility]   Orthopedic & Sports Medicine  Office: [phone]  Fax: [phone]

## 2024-07-30 NOTE — Progress Notes
 Small Joint Drain/Inject: L thumb CMC on 07/30/2024 11:50 AM    Consent:   Consent obtained: verbal  Consent given by: patient  Risks discussed: skin discoloration, bleeding, damage to surrounding structures, hyperglycemia, infection, pain, soft tissue reaction, subcutaneous fat atrophy, tendon rupture, vasovagal reaction and arrhythmia  Alternatives discussed: alternative treatment, delayed treatment and no treatment  Discussed with patient the purpose of the treatment/procedure, other ways of treating my condition, including no treatment/ procedure and the risks and benefits of the alternatives. Patient has decided to proceed with treatment/procedure.        Universal Protocol:  Relevant documents: relevant documents present and verified  Patient identity confirmed: Patient identify confirmed verbally with patient.          Procedures Details:  Procedure Peformed: Injection Only  Indications: pain  Details:Prep: alcohol   25 G needle, dorsal approachMedications: 1 mL lidocaine  PF 1% (10 mg/mL); 40 mg triamcinolone  acetonide 40 mg/mL  Outcome: tolerated well, no immediate complications

## 2025-01-24 ENCOUNTER — Encounter: Admit: 2025-01-24 | Discharge: 2025-01-24 | Payer: MEDICARE | Primary: Family

## 2025-01-24 NOTE — Telephone Encounter [36]
 Received message from staff member stating that patient had reached out to scheduling department to request phone call from nursing staff to discuss concern over new growth on face. This nurse contacted patient via phone as requested and patient notified of new growth on face underneath eye that she would like evaluated by provider prior to June of this year due to previous history of skin cancer.    This nurse offered appointment with Dr. Marvene on 2/10 at 8:45 AM at [facility] for evaluation of spot of concern on face. Patient agreeable to schedule appointment as offered. Appointment scheduled accordingly. This nurse discussed possibility of performing full body skin exam during appointment on 2/10 at 8:45 AM but also explained that due to time constraints sometimes this is not possible and advised to keep appointment as scheduled for June as well. Patient verbalizes understanding. No further needs from clinic at time of phone call.

## 2025-01-25 ENCOUNTER — Encounter: Admit: 2025-01-25 | Discharge: 2025-01-25 | Payer: MEDICARE | Primary: Family

## 2025-01-25 ENCOUNTER — Ambulatory Visit: Admit: 2025-01-25 | Discharge: 2025-01-26 | Payer: MEDICARE | Primary: Family

## 2025-01-25 ENCOUNTER — Ambulatory Visit: Admit: 2025-01-25 | Discharge: 2025-01-25 | Payer: MEDICARE | Primary: Family

## 2025-01-25 DIAGNOSIS — M79641 Pain in right hand: Principal | ICD-10-CM

## 2025-01-25 DIAGNOSIS — M180 Bilateral primary osteoarthritis of first carpometacarpal joints: Secondary | ICD-10-CM

## 2025-01-25 MED ORDER — TRIAMCINOLONE ACETONIDE 40 MG/ML IJ SUSP
40 mg | Freq: Once | INTRAMUSCULAR | 0 refills | Status: CP | PRN
Start: 2025-01-25 — End: ?

## 2025-01-25 MED ORDER — LIDOCAINE (PF) 10 MG/ML (1 %) IJ SOLN
1 mL | Freq: Once | INTRAMUSCULAR | 0 refills | Status: CP | PRN
Start: 2025-01-25 — End: ?

## 2025-01-25 NOTE — Progress Notes [1]
 [facility] Hand Surgery      Date of Service: 01/25/2025    Subjective:           Bilateral thumb pain    History of Present Illness  This is a 69 year old female accompanied by her adult daughter who helps translate for her at her request.  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  Conservative care has been helpful.  She states that the steroid injections have been lasting over 5 months.  She had been using her MetaGrip braces but the Velcro started to wear out.  She wonders if she should have surgery.  She is getting ready to move to [place] and she recently retired.  Shavona Gunderman is a 69 y.o. female.     Review of Systems      Objective:          atorvastatin  (LIPITOR) 40 mg tablet Take one tablet by mouth at bedtime daily.    JANUVIA  100 mg tab tablet Take one tablet by mouth daily after lunch. (Patient not taking: Reported on 01/25/2025)    metFORMIN  (GLUCOPHAGE ) 850 mg tablet Take one tablet by mouth twice daily with meals.     There were no vitals filed for this visit.  There is no height or weight on file to calculate BMI.     Physical Exam  Constitutional:       General: She is not in acute distress.     Appearance: Normal appearance.   Neurological:      Mental Status: She is oriented to person, place, and time.   Psychiatric:         Behavior: Behavior normal.       Ortho Exam  Bilateral hands: The skin is intact, there are no wounds or scars.  She has full range of motion of the wrist and digits with some tenderness over the thumb CMC joints with a positive CMC grind test bilaterally.  She has intact sensation and brisk cap refill to all digits.       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  We talked about surgical options which would be a [facility] arthroplasty under local anesthesia.  I counseled her my concern is that conservative care has still been helpful.  With ongoing conservative care the pain symptoms may simply fade and not return.  Surgery certainly carries its own risks and can aggravate her pain problem to be worse.  We talked about all options and she would like to continue with conservative care.  We will give her more injections today and replace her MetaGrip braces.  She is welcome to follow-up on an as-needed basis.    Small Joint Injection/Aspiration: R thumb CMC on 01/25/2025 8:30 AM    Consent:   Consent obtained: verbal  Consent given by: patient  Risks discussed: skin discoloration, bleeding, damage to surrounding structures, hyperglycemia, infection, pain, soft tissue reaction, subcutaneous fat atrophy, tendon rupture, vasovagal reaction and arrhythmia  Alternatives discussed: alternative treatment, delayed treatment and no treatment  Discussed with patient the purpose of the treatment/procedure, other ways of treating my condition, including no treatment/ procedure and the risks and benefits of the alternatives. Patient has decided to proceed with treatment/procedure.        Universal Protocol:  Relevant documents: relevant documents present and verified  Patient identity confirmed: Patient identify confirmed verbally with patient.  Procedures Details:  Procedure Peformed: Injection Only  Indications: pain  Details:Prep: alcohol   25 G needle, dorsal approachMedications: 40 mg triamcinolone  acetonide 40 mg/mL; 1 mL lidocaine  PF 1% (10 mg/mL)  Outcome: tolerated well, no immediate complications                             Donnice Boehringer, MD  Associate Professor  Orthopedic Hand Surgery

## 2025-01-25 NOTE — Progress Notes [1]
 Small Joint Injection/Aspiration: L thumb CMC on 01/25/2025 8:30 AM    Consent:   Consent obtained: verbal  Consent given by: patient  Risks discussed: skin discoloration, bleeding, damage to surrounding structures, hyperglycemia, infection, pain, soft tissue reaction, subcutaneous fat atrophy, tendon rupture, vasovagal reaction and arrhythmia  Alternatives discussed: alternative treatment, delayed treatment and no treatment  Discussed with patient the purpose of the treatment/procedure, other ways of treating my condition, including no treatment/ procedure and the risks and benefits of the alternatives. Patient has decided to proceed with treatment/procedure.        Universal Protocol:  Relevant documents: relevant documents present and verified  Patient identity confirmed: Patient identify confirmed verbally with patient.          Procedures Details:  Procedure Peformed: Injection Only  Indications: pain  Details:Prep: alcohol   25 G needle, dorsal approachMedications: 40 mg triamcinolone  acetonide 40 mg/mL; 1 mL lidocaine  PF 1% (10 mg/mL)  Outcome: tolerated well, no immediate complications
